# Patient Record
Sex: Male | Born: 1935 | Race: White | Hispanic: No | Marital: Married
Health system: Southern US, Community
[De-identification: ages and names within clinical notes are randomized; demographics above are authoritative.]

---

## 2007-02-23 ENCOUNTER — Encounter: Payer: Self-pay | Admitting: Rheumatology

## 2007-03-17 ENCOUNTER — Encounter: Payer: Self-pay | Admitting: Rheumatology

## 2007-04-22 ENCOUNTER — Ambulatory Visit: Payer: Self-pay | Admitting: General Practice

## 2007-04-28 ENCOUNTER — Ambulatory Visit: Payer: Self-pay | Admitting: General Practice

## 2007-05-27 ENCOUNTER — Ambulatory Visit: Payer: Self-pay | Admitting: Pain Medicine

## 2007-06-11 ENCOUNTER — Ambulatory Visit: Payer: Self-pay | Admitting: Pain Medicine

## 2007-06-22 ENCOUNTER — Ambulatory Visit: Payer: Self-pay | Admitting: Physician Assistant

## 2007-06-25 ENCOUNTER — Ambulatory Visit: Payer: Self-pay | Admitting: Pain Medicine

## 2007-07-02 ENCOUNTER — Encounter: Payer: Self-pay | Admitting: Pain Medicine

## 2007-07-09 ENCOUNTER — Ambulatory Visit: Payer: Self-pay | Admitting: Physician Assistant

## 2007-07-18 ENCOUNTER — Encounter: Payer: Self-pay | Admitting: Pain Medicine

## 2007-07-28 ENCOUNTER — Ambulatory Visit: Payer: Self-pay | Admitting: Pain Medicine

## 2007-08-17 ENCOUNTER — Ambulatory Visit: Payer: Self-pay | Admitting: Pain Medicine

## 2007-11-05 ENCOUNTER — Encounter: Payer: Self-pay | Admitting: Pain Medicine

## 2007-11-23 ENCOUNTER — Encounter: Payer: Self-pay | Admitting: Internal Medicine

## 2007-12-16 ENCOUNTER — Encounter: Payer: Self-pay | Admitting: Internal Medicine

## 2008-01-08 ENCOUNTER — Ambulatory Visit: Payer: Self-pay | Admitting: Internal Medicine

## 2008-01-15 ENCOUNTER — Encounter: Payer: Self-pay | Admitting: Internal Medicine

## 2008-03-02 ENCOUNTER — Encounter: Payer: Self-pay | Admitting: Internal Medicine

## 2008-03-16 ENCOUNTER — Encounter: Payer: Self-pay | Admitting: Internal Medicine

## 2008-03-23 ENCOUNTER — Ambulatory Visit: Payer: Self-pay | Admitting: Internal Medicine

## 2008-03-31 ENCOUNTER — Ambulatory Visit: Payer: Self-pay | Admitting: Rheumatology

## 2008-04-16 ENCOUNTER — Encounter: Payer: Self-pay | Admitting: Internal Medicine

## 2008-04-27 ENCOUNTER — Ambulatory Visit: Payer: Self-pay | Admitting: General Practice

## 2008-04-28 ENCOUNTER — Ambulatory Visit: Payer: Self-pay | Admitting: Internal Medicine

## 2008-05-17 ENCOUNTER — Encounter: Payer: Self-pay | Admitting: Internal Medicine

## 2008-06-07 ENCOUNTER — Ambulatory Visit: Payer: Self-pay | Admitting: Pain Medicine

## 2008-06-16 ENCOUNTER — Encounter: Payer: Self-pay | Admitting: Internal Medicine

## 2008-06-27 ENCOUNTER — Ambulatory Visit: Payer: Self-pay | Admitting: Physician Assistant

## 2008-07-11 ENCOUNTER — Ambulatory Visit: Payer: Self-pay | Admitting: Unknown Physician Specialty

## 2008-07-13 ENCOUNTER — Inpatient Hospital Stay: Payer: Self-pay | Admitting: Unknown Physician Specialty

## 2008-07-19 ENCOUNTER — Encounter: Payer: Self-pay | Admitting: Internal Medicine

## 2008-09-29 ENCOUNTER — Encounter: Payer: Self-pay | Admitting: Unknown Physician Specialty

## 2008-10-17 ENCOUNTER — Encounter: Payer: Self-pay | Admitting: Unknown Physician Specialty

## 2008-11-14 ENCOUNTER — Encounter: Payer: Self-pay | Admitting: Unknown Physician Specialty

## 2009-01-09 ENCOUNTER — Ambulatory Visit: Payer: Self-pay | Admitting: General Practice

## 2009-01-24 ENCOUNTER — Inpatient Hospital Stay: Payer: Self-pay | Admitting: General Practice

## 2009-01-31 ENCOUNTER — Encounter: Payer: Self-pay | Admitting: Internal Medicine

## 2009-02-14 ENCOUNTER — Encounter: Payer: Self-pay | Admitting: Internal Medicine

## 2009-03-16 ENCOUNTER — Encounter: Payer: Self-pay | Admitting: Internal Medicine

## 2009-04-03 ENCOUNTER — Encounter: Payer: Self-pay | Admitting: General Practice

## 2009-04-16 ENCOUNTER — Encounter: Payer: Self-pay | Admitting: General Practice

## 2009-05-17 ENCOUNTER — Encounter: Payer: Self-pay | Admitting: General Practice

## 2009-06-16 ENCOUNTER — Encounter: Payer: Self-pay | Admitting: General Practice

## 2009-08-05 ENCOUNTER — Ambulatory Visit: Payer: Self-pay | Admitting: Internal Medicine

## 2009-10-18 ENCOUNTER — Ambulatory Visit: Payer: Self-pay | Admitting: Unknown Physician Specialty

## 2009-11-17 ENCOUNTER — Ambulatory Visit: Payer: Self-pay | Admitting: Ophthalmology

## 2009-11-17 ENCOUNTER — Ambulatory Visit: Payer: Self-pay | Admitting: Cardiovascular Disease

## 2009-11-27 ENCOUNTER — Ambulatory Visit: Payer: Self-pay | Admitting: Ophthalmology

## 2010-04-05 ENCOUNTER — Encounter: Payer: Self-pay | Admitting: Surgery

## 2010-04-16 ENCOUNTER — Encounter: Payer: Self-pay | Admitting: Surgery

## 2010-05-17 ENCOUNTER — Encounter: Payer: Self-pay | Admitting: Surgery

## 2010-06-16 ENCOUNTER — Encounter: Payer: Self-pay | Admitting: Surgery

## 2010-08-14 ENCOUNTER — Ambulatory Visit: Payer: Self-pay | Admitting: General Practice

## 2010-08-27 ENCOUNTER — Inpatient Hospital Stay: Payer: Self-pay | Admitting: General Practice

## 2010-08-29 LAB — PATHOLOGY REPORT

## 2010-08-31 ENCOUNTER — Encounter: Payer: Self-pay | Admitting: Internal Medicine

## 2010-09-16 ENCOUNTER — Encounter: Payer: Self-pay | Admitting: Internal Medicine

## 2010-10-17 ENCOUNTER — Encounter: Payer: Self-pay | Admitting: Internal Medicine

## 2011-01-09 ENCOUNTER — Encounter: Payer: Self-pay | Admitting: Surgery

## 2011-01-15 ENCOUNTER — Encounter: Payer: Self-pay | Admitting: Surgery

## 2011-02-06 ENCOUNTER — Ambulatory Visit: Payer: Self-pay | Admitting: Unknown Physician Specialty

## 2011-02-15 ENCOUNTER — Encounter: Payer: Self-pay | Admitting: Surgery

## 2011-08-16 ENCOUNTER — Emergency Department: Payer: Self-pay | Admitting: *Deleted

## 2011-08-29 ENCOUNTER — Ambulatory Visit: Payer: Self-pay

## 2011-09-02 ENCOUNTER — Ambulatory Visit: Payer: Self-pay

## 2011-09-04 ENCOUNTER — Ambulatory Visit: Payer: Self-pay | Admitting: General Surgery

## 2011-09-26 ENCOUNTER — Ambulatory Visit: Payer: Self-pay | Admitting: General Surgery

## 2011-09-27 LAB — CANCER ANTIGEN 19-9: CA 19-9: 14 U/mL (ref 0–35)

## 2011-10-18 ENCOUNTER — Ambulatory Visit: Payer: Self-pay | Admitting: General Surgery

## 2011-11-01 ENCOUNTER — Ambulatory Visit: Payer: Self-pay | Admitting: Cardiovascular Disease

## 2011-11-01 LAB — CBC WITH DIFFERENTIAL/PLATELET
Basophil #: 0 x10 3/mm 3 (ref 0.0–0.1)
Basophil %: 0.8 %
Eosinophil #: 0.2 x10 3/mm 3 (ref 0.0–0.7)
Eosinophil %: 3.3 %
HCT: 43.8 % (ref 40.0–52.0)
HGB: 14.8 g/dL (ref 13.0–18.0)
Lymphocyte %: 30.8 %
Lymphs Abs: 1.6 x10 3/mm 3 (ref 1.0–3.6)
MCH: 30.3 pg (ref 26.0–34.0)
MCHC: 33.7 g/dL (ref 32.0–36.0)
MCV: 90 fL (ref 80–100)
Monocyte #: 0.5 x10 3/mm 3 (ref 0.0–0.7)
Monocyte %: 10.3 %
Neutrophil #: 2.9 x10 3/mm 3 (ref 1.4–6.5)
Neutrophil %: 54.8 %
Platelet: 190 x10 3/mm 3 (ref 150–440)
RBC: 4.88 x10 6/mm 3 (ref 4.40–5.90)
RDW: 13.6 % (ref 11.5–14.5)
WBC: 5.3 x10 3/mm 3 (ref 3.8–10.6)

## 2011-11-01 LAB — URINALYSIS, COMPLETE
Bacteria: NONE SEEN
Bilirubin,UR: NEGATIVE
Blood: NEGATIVE
Glucose,UR: NEGATIVE mg/dL (ref 0–75)
Ketone: NEGATIVE
Nitrite: NEGATIVE
Ph: 5 (ref 4.5–8.0)
Protein: NEGATIVE
RBC,UR: <1 /HPF (ref 0–5)
Specific Gravity: 1.01 (ref 1.003–1.030)
Squamous Epithelial: <1
WBC UR: 2 /HPF (ref 0–5)

## 2011-11-01 LAB — BASIC METABOLIC PANEL WITH GFR
Anion Gap: 10 (ref 7–16)
BUN: 18 mg/dL (ref 7–18)
Calcium, Total: 9.1 mg/dL (ref 8.5–10.1)
Chloride: 104 mmol/L (ref 98–107)
Co2: 26 mmol/L (ref 21–32)
Creatinine: 1.06 mg/dL (ref 0.60–1.30)
EGFR (African American): >60
EGFR (Non-African Amer.): >60
Glucose: 106 mg/dL — ABNORMAL HIGH (ref 65–99)
Osmolality: 282 (ref 275–301)
Potassium: 4.4 mmol/L (ref 3.5–5.1)
Sodium: 140 mmol/L (ref 136–145)

## 2011-11-08 ENCOUNTER — Ambulatory Visit: Payer: Self-pay | Admitting: Cardiovascular Disease

## 2011-11-15 ENCOUNTER — Ambulatory Visit: Payer: Self-pay | Admitting: General Surgery

## 2011-12-12 ENCOUNTER — Emergency Department: Payer: Self-pay | Admitting: Emergency Medicine

## 2012-02-11 ENCOUNTER — Encounter: Payer: Self-pay | Admitting: General Practice

## 2012-02-15 ENCOUNTER — Encounter: Payer: Self-pay | Admitting: General Practice

## 2012-04-30 ENCOUNTER — Encounter: Payer: Self-pay | Admitting: Unknown Physician Specialty

## 2012-05-17 ENCOUNTER — Encounter: Payer: Self-pay | Admitting: Unknown Physician Specialty

## 2012-06-16 ENCOUNTER — Encounter: Payer: Self-pay | Admitting: Unknown Physician Specialty

## 2012-07-17 ENCOUNTER — Encounter: Payer: Self-pay | Admitting: Unknown Physician Specialty

## 2012-08-19 IMAGING — CT CT CHEST W/ CM
1 of 2 series · 14 of 30 positions shown, 18 images · IV contrast (agent unspecified)
Comparison: Prior chest x-ray of 08/29/2010.

REASON FOR EXAM: abnormal chest xray plural markings on chest XR
COMMENTS:

PROCEDURE:     KCT - KCT CHEST WITH CONTRAST  - August 29, 2011 [DATE]
RESULT:
HISTORY: Abnormal chest x-ray. Pleural thickening.

[Series 2: chest w/ 5.0 i41f 3 · axial · 0.78mm/px · z∈[-343,-68]mm · 14 of 65 slices shown, 18 images]
[im 5/65  mediastinal]
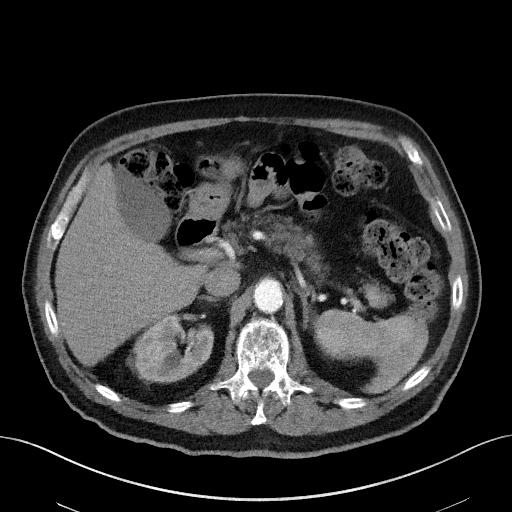
[im 5/65  lung]
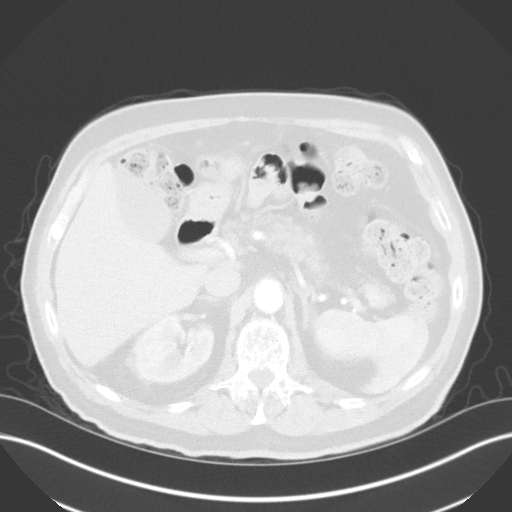
[im 10/65  lung]
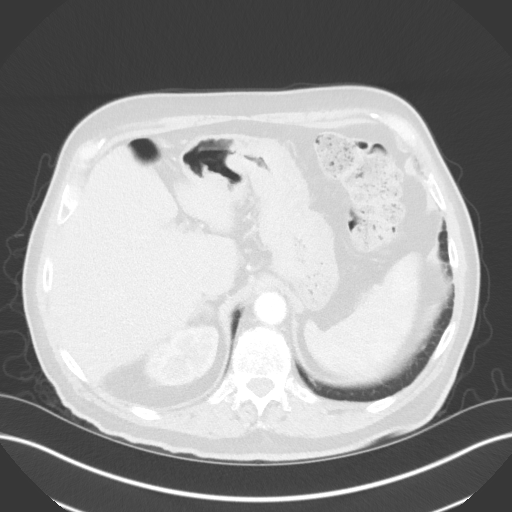
[im 14/65  lung]
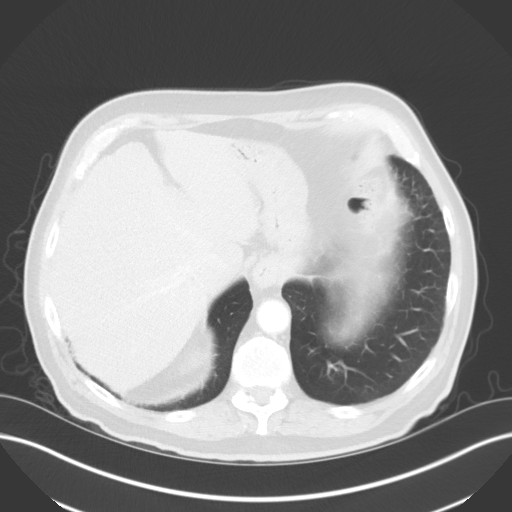
[im 19/65  lung]
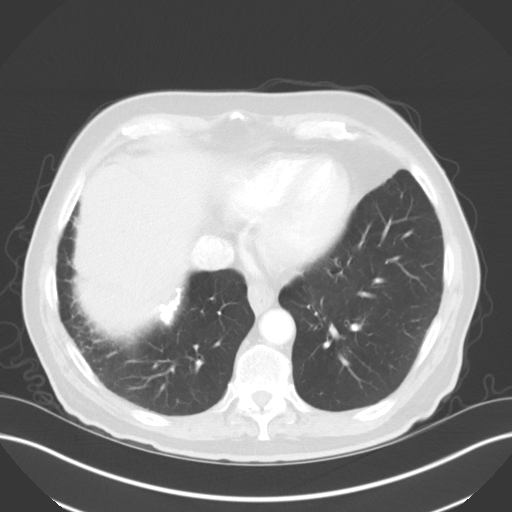
[im 23/65  mediastinal]
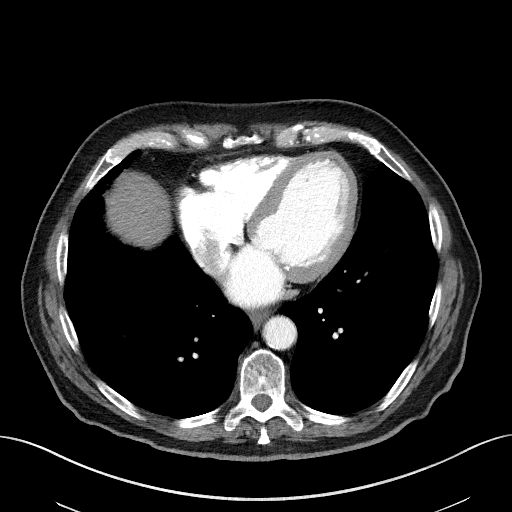
[im 23/65  lung]
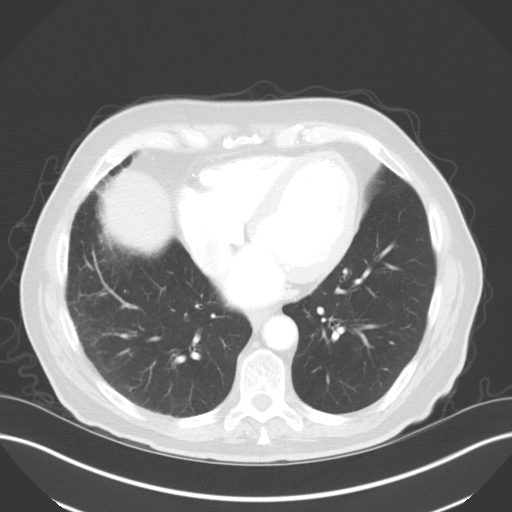
[im 28/65  lung]
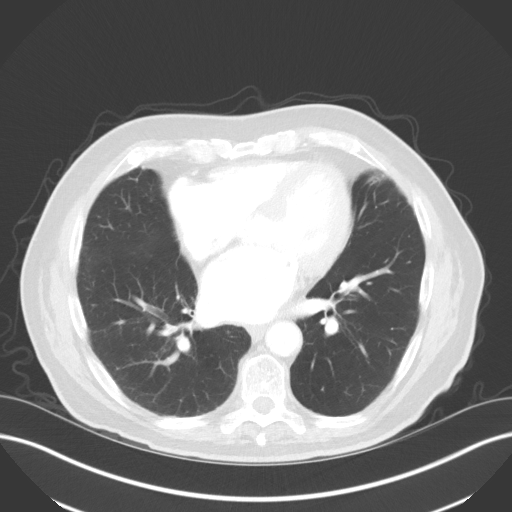
[im 31/65  lung]
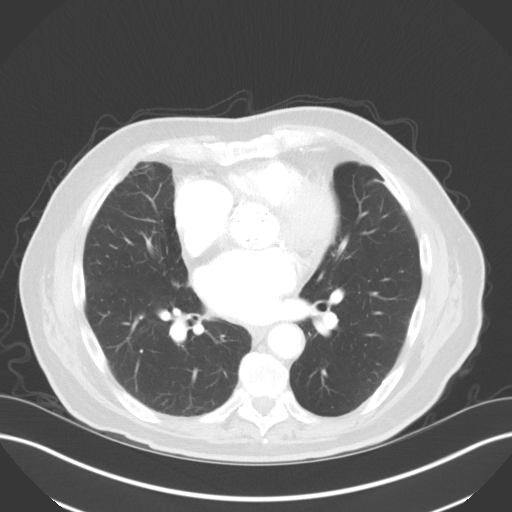
[im 33/65  lung]
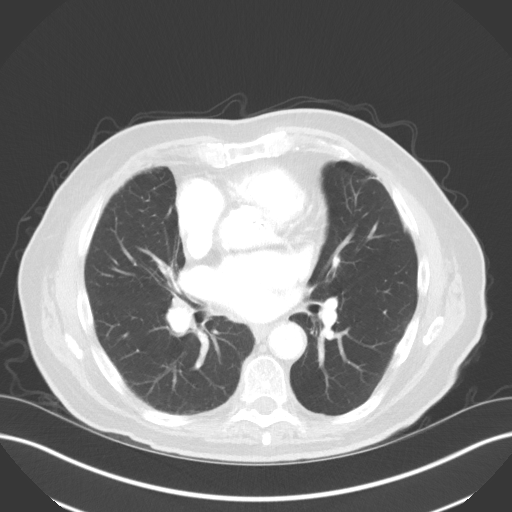
[im 37/65  mediastinal]
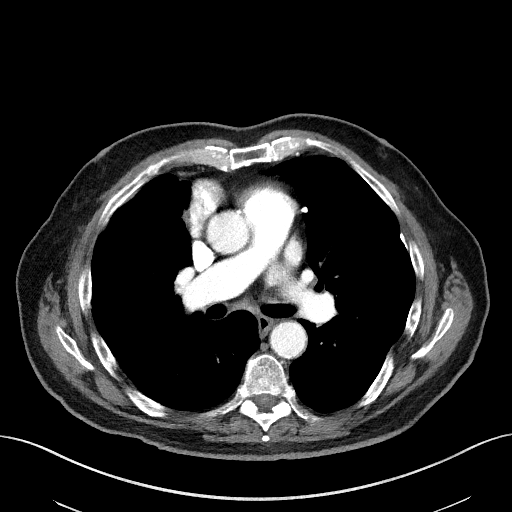
[im 37/65  lung]
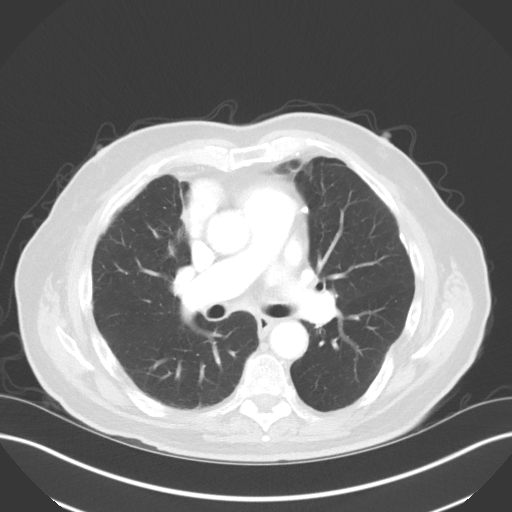
[im 42/65  lung]
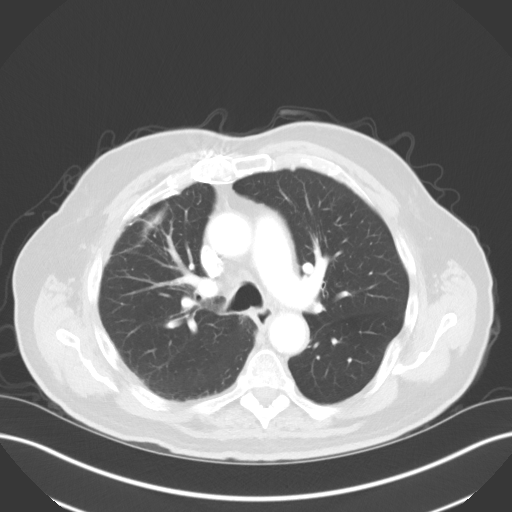
[im 46/65  lung]
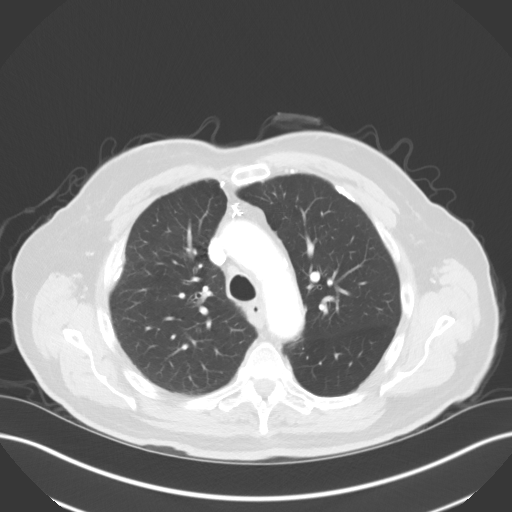
[im 51/65  lung]
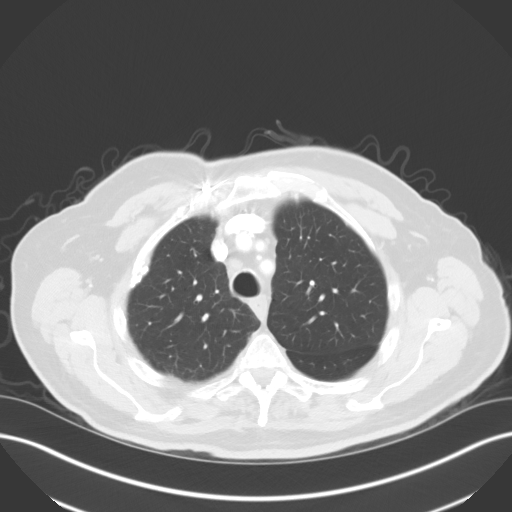
[im 55/65  mediastinal]
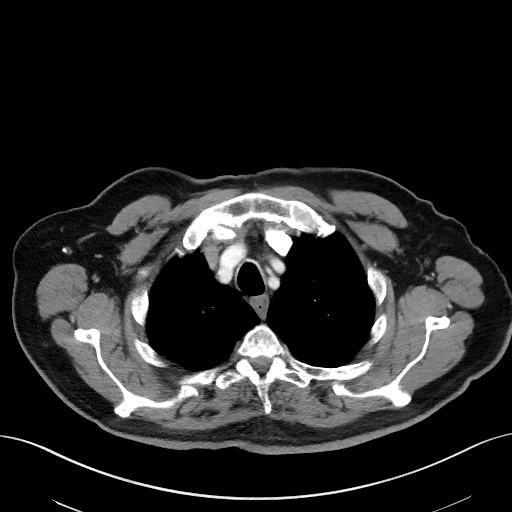
[im 55/65  lung]
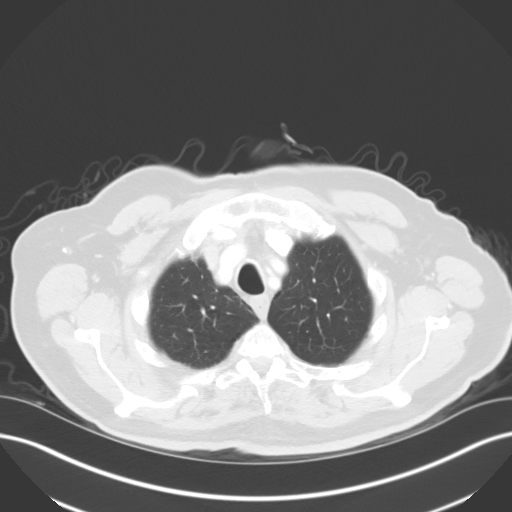
[im 60/65  lung]
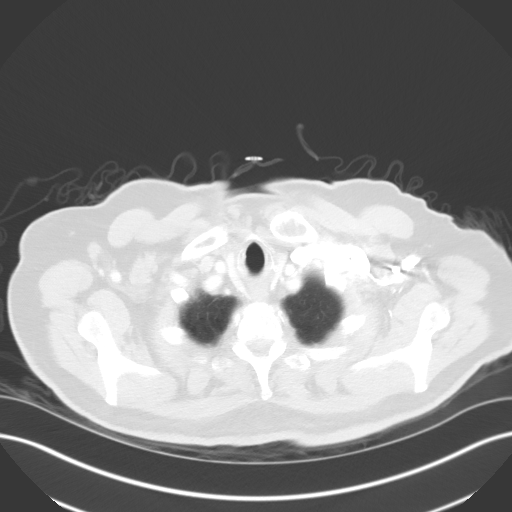

[14 of 30 positions shown; findings below may reference images not displayed]

PROCEDURE AND FINDINGS:   Standard CT obtained with 75 ml of Osovue-VDD. The
large airways are patent. The lungs are clear. Tiny, 1 to 2 mm, nonspecific
pulmonary nodules are noted, best seen on lung CAD images. These most likely
represent granulomas. Calcified pleural plaques are present on the right.
This may be from scarring. Prior asbestos exposure could also present in
this fashion. A pancreatic tail mass cannot be excluded. CT of the abdomen
is suggested for further evaluation. Coronary artery disease. Mitral annular
calcification.
IMPRESSION: 1.  Pancreatic tail mass. For further evaluation, CT of the abdomen is
suggested.
2.  Tiny, pinpoint nodules are noted throughout both lungs. These may
represent granulomas. Tiny, metastatic foci cannot be excluded.
3.  Calcified right pleural plaques. This may be from scarring; however,
prior asbestos exposure could also present in this fashion.
4.  Coronary artery disease.
5.  Mitral annular calcification.

## 2012-12-02 IMAGING — CT CT HEAD WITHOUT CONTRAST
1 series · 16 of 30 positions shown, 20 images · non-contrast
Comparison: none

REASON FOR EXAM: s/p fall
COMMENTS:

[Series 602: soft tissue · axial · 0.41mm/px · z∈[-110,+24]mm · 16 of 31 slices shown, 20 images]
[im 2/31  brain]
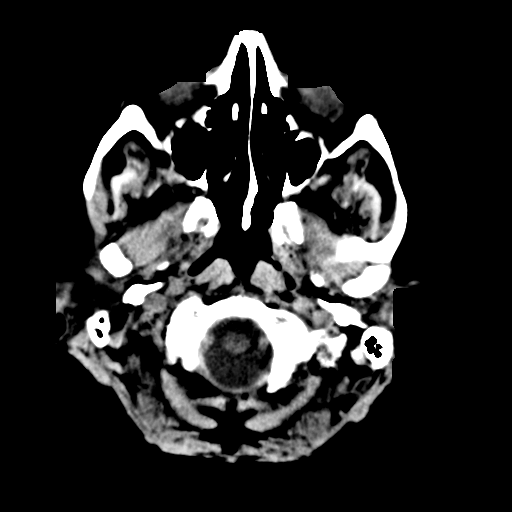
[im 2/31  bone]
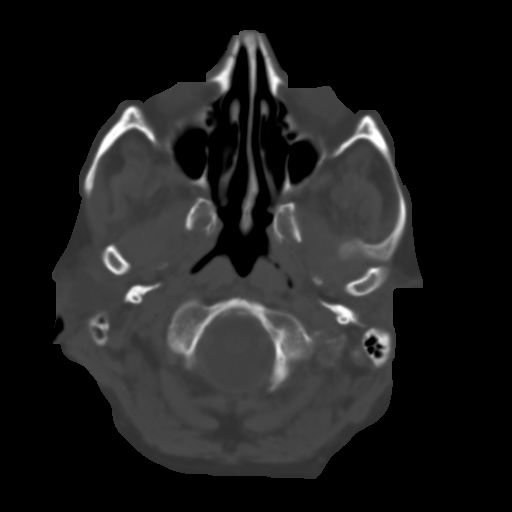
[im 4/31  brain]
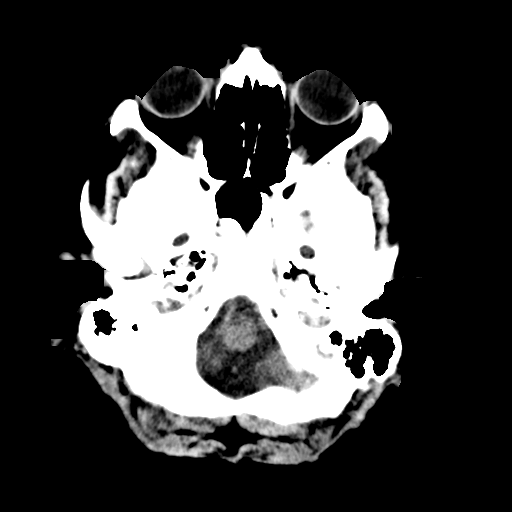
[im 6/31  brain]
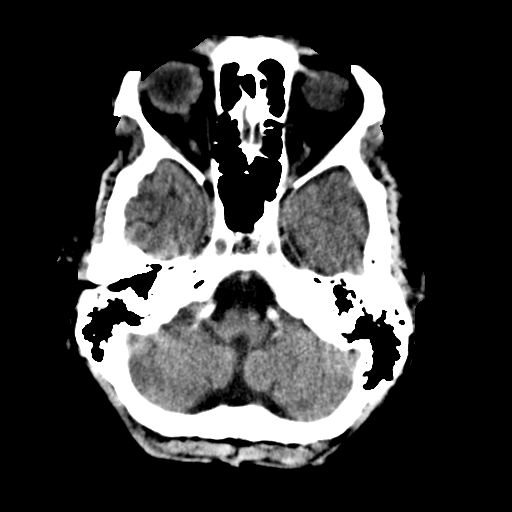
[im 8/31  brain]
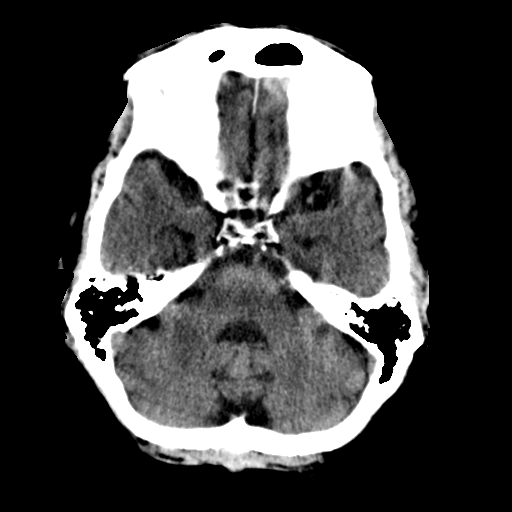
[im 9/31  brain]
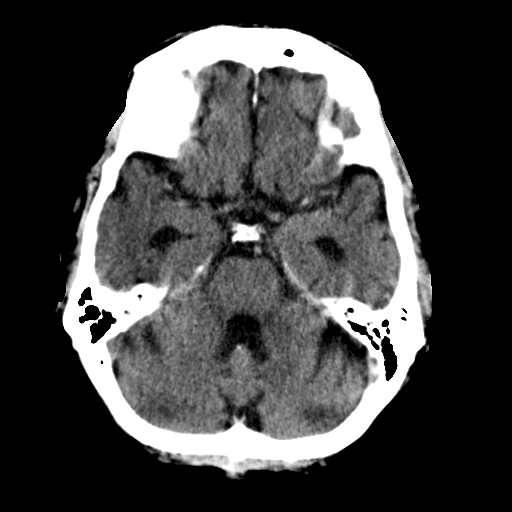
[im 9/31  bone]
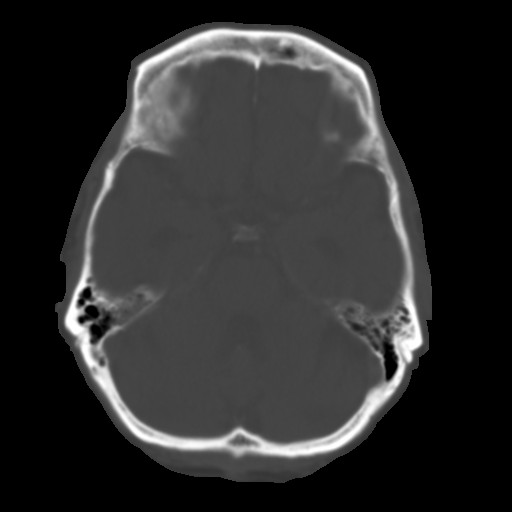
[im 11/31  brain]
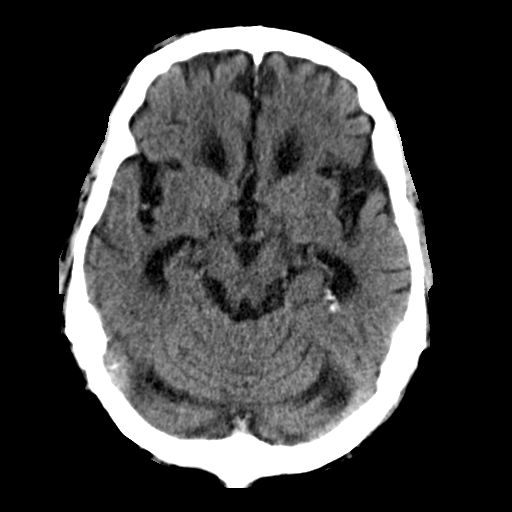
[im 13/31  brain]
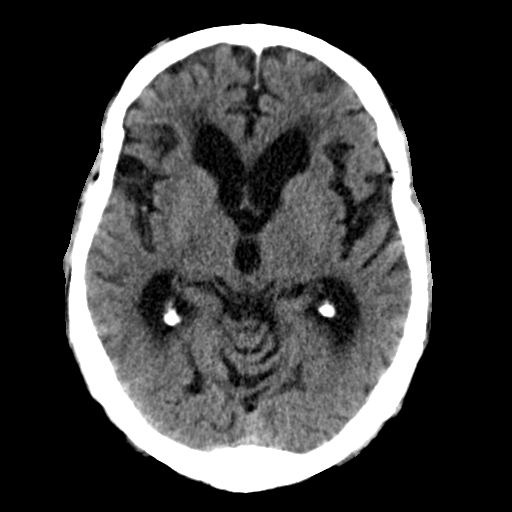
[im 15/31  brain]
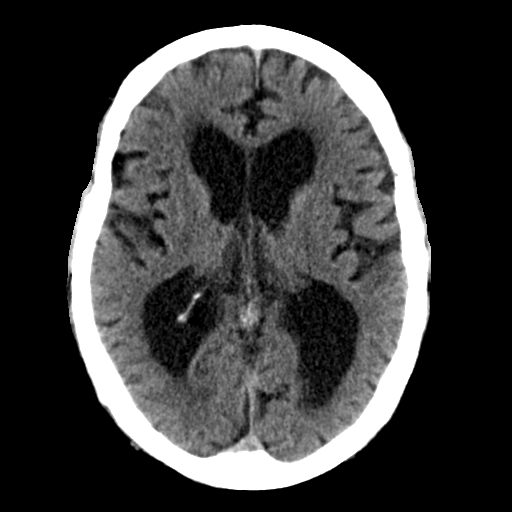
[im 16/31  brain]
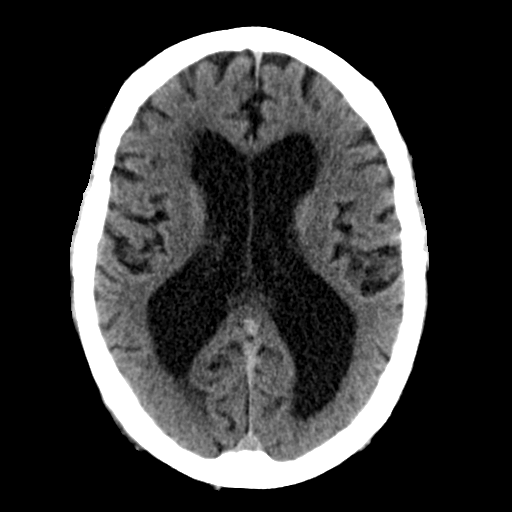
[im 16/31  bone]
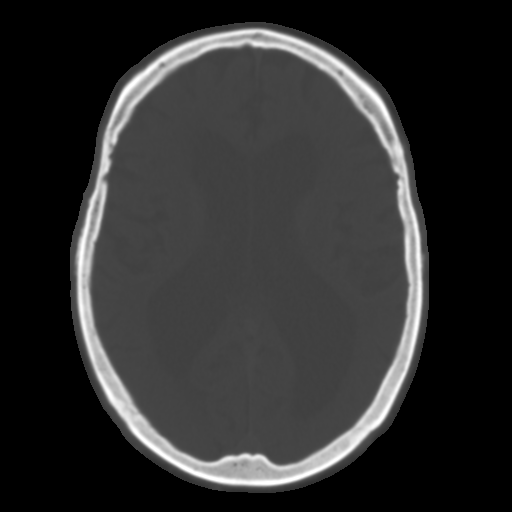
[im 18/31  brain]
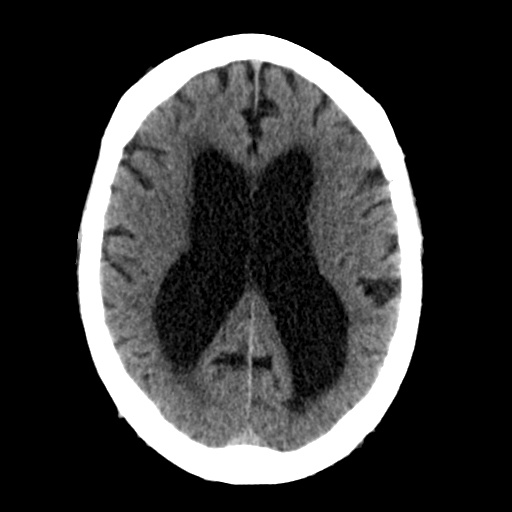
[im 20/31  brain]
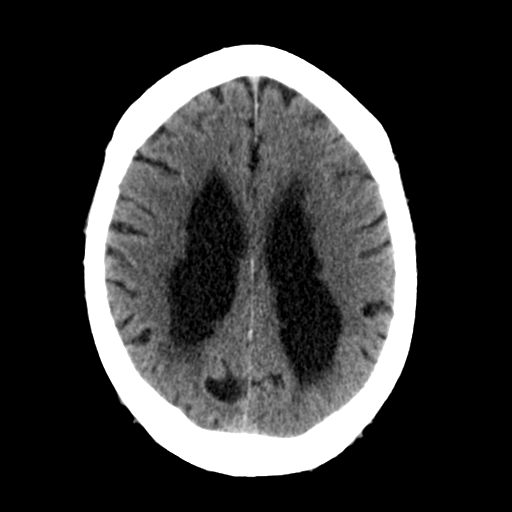
[im 22/31  brain]
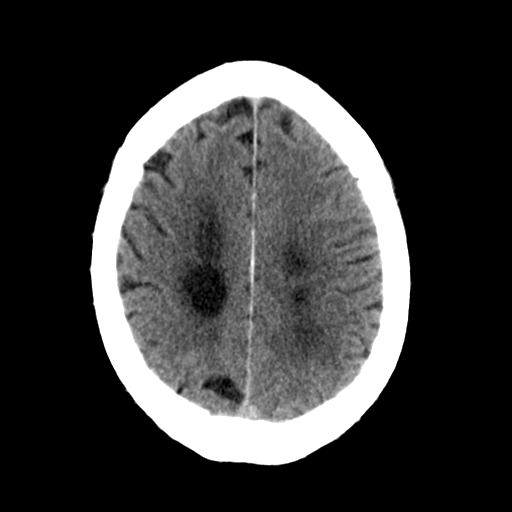
[im 23/31  brain]
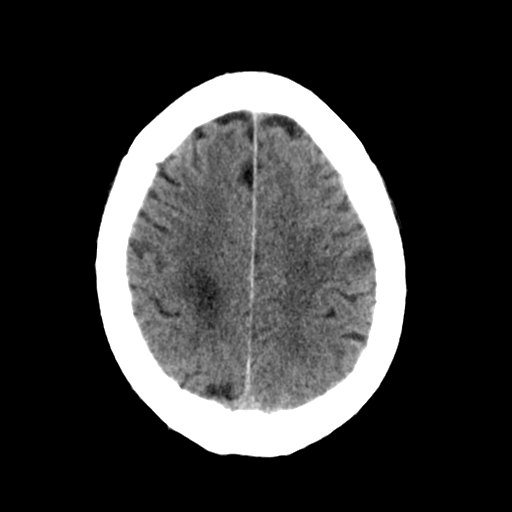
[im 23/31  bone]
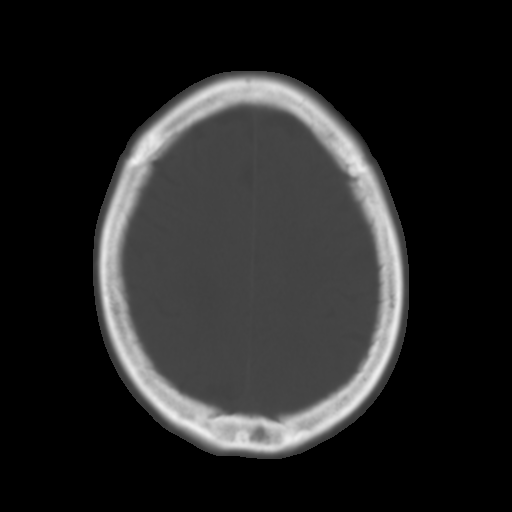
[im 25/31  brain]
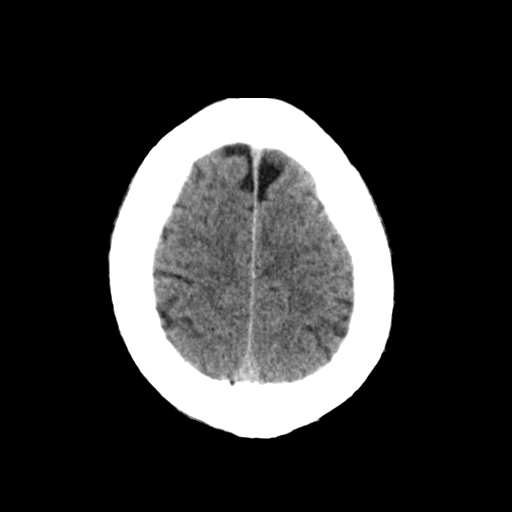
[im 27/31  brain]
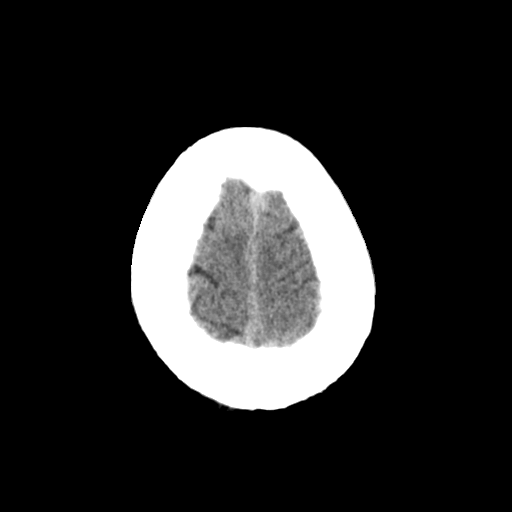
[im 29/31  brain]
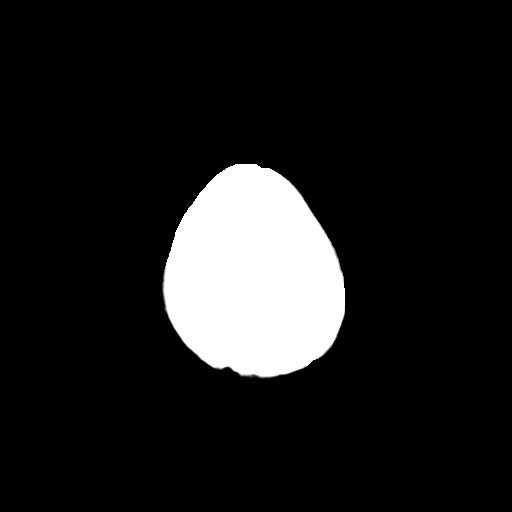

[16 of 30 positions shown; findings below may reference images not displayed]

PROCEDURE:     CT  - CT HEAD WITHOUT CONTRAST  - December 12, 2011  [DATE]

RESULT:     Axial noncontrast CT scanning was performed through the brain
with reconstructions at 5 mm intervals and slice thicknesses. Comparison is
made to study August 16, 2011.

There is moderate ventriculomegaly which appears stable. There is moderate
diffuse cerebral and cerebellar atrophy which is also stable. There is no
intracranial hemorrhage nor intracranial mass effect. There is no evidence
of an evolving ischemic infarction. At bone window settings the observed
portions of the paranasal sinuses and mastoid air cells are clear. I do not
see evidence of an acute skull fracture.
IMPRESSION: 1. I see no evidence of an acute intracranial hemorrhage. There is no
evidence of an evolving ischemic infarction.
2. There is chronic moderate hydrocephalus with some mild to moderate
diffuse cerebral and cerebellar atrophy.

## 2012-12-31 ENCOUNTER — Ambulatory Visit: Payer: Self-pay | Admitting: Physical Medicine and Rehabilitation

## 2013-07-30 ENCOUNTER — Ambulatory Visit: Payer: Self-pay

## 2014-08-16 ENCOUNTER — Emergency Department: Payer: Self-pay | Admitting: Emergency Medicine

## 2014-08-16 LAB — URINALYSIS, COMPLETE
BLOOD: NEGATIVE
Bacteria: NONE SEEN
Bilirubin,UR: NEGATIVE
Glucose,UR: NEGATIVE mg/dL (ref 0–75)
Hyaline Cast: 13
Ketone: NEGATIVE
LEUKOCYTE ESTERASE: NEGATIVE
NITRITE: NEGATIVE
Ph: 5 (ref 4.5–8.0)
Protein: NEGATIVE
RBC,UR: <1 /HPF (ref 0–5)
Specific Gravity: 1.011 (ref 1.003–1.030)
Squamous Epithelial: <1
WBC UR: 3 /HPF (ref 0–5)

## 2014-08-16 LAB — COMPREHENSIVE METABOLIC PANEL
Albumin: 3.4 g/dL (ref 3.4–5.0)
Alkaline Phosphatase: 109 U/L
Anion Gap: 3 — ABNORMAL LOW (ref 7–16)
BUN: 28 mg/dL — ABNORMAL HIGH (ref 7–18)
Bilirubin,Total: 1.4 mg/dL — ABNORMAL HIGH (ref 0.2–1.0)
CHLORIDE: 99 mmol/L (ref 98–107)
CREATININE: 1.56 mg/dL — AB (ref 0.60–1.30)
Calcium, Total: 8.7 mg/dL (ref 8.5–10.1)
Co2: 36 mmol/L — ABNORMAL HIGH (ref 21–32)
EGFR (Non-African Amer.): 45 — ABNORMAL LOW
GFR CALC AF AMER: 55 — AB
Glucose: 117 mg/dL — ABNORMAL HIGH (ref 65–99)
OSMOLALITY: 282 (ref 275–301)
POTASSIUM: 3.9 mmol/L (ref 3.5–5.1)
SGOT(AST): 22 U/L (ref 15–37)
SGPT (ALT): 23 U/L
SODIUM: 138 mmol/L (ref 136–145)
Total Protein: 7.5 g/dL (ref 6.4–8.2)

## 2014-08-16 LAB — TROPONIN I

## 2014-08-16 LAB — CBC WITH DIFFERENTIAL/PLATELET
BASOS PCT: 0.6 %
Basophil #: 0 10*3/uL (ref 0.0–0.1)
EOS ABS: 0.2 10*3/uL (ref 0.0–0.7)
EOS PCT: 2.6 %
HCT: 38.9 % — AB (ref 40.0–52.0)
HGB: 12.5 g/dL — AB (ref 13.0–18.0)
Lymphocyte #: 0.8 10*3/uL — ABNORMAL LOW (ref 1.0–3.6)
Lymphocyte %: 12.9 %
MCH: 30.4 pg (ref 26.0–34.0)
MCHC: 32.1 g/dL (ref 32.0–36.0)
MCV: 95 fL (ref 80–100)
MONOS PCT: 12.9 %
Monocyte #: 0.8 x10 3/mm (ref 0.2–1.0)
Neutrophil #: 4.3 10*3/uL (ref 1.4–6.5)
Neutrophil %: 71 %
Platelet: 135 10*3/uL — ABNORMAL LOW (ref 150–440)
RBC: 4.1 10*6/uL — AB (ref 4.40–5.90)
RDW: 15.5 % — AB (ref 11.5–14.5)
WBC: 6.1 10*3/uL (ref 3.8–10.6)

## 2014-08-16 LAB — LIPASE, BLOOD: Lipase: 165 U/L (ref 73–393)

## 2014-09-18 ENCOUNTER — Inpatient Hospital Stay: Payer: Self-pay | Admitting: Internal Medicine

## 2014-09-18 LAB — URINALYSIS, COMPLETE
BACTERIA: NONE SEEN
Bilirubin,UR: NEGATIVE
Blood: NEGATIVE
Glucose,UR: NEGATIVE mg/dL (ref 0–75)
KETONE: NEGATIVE
Leukocyte Esterase: NEGATIVE
Nitrite: NEGATIVE
Ph: 5 (ref 4.5–8.0)
RBC,UR: 3 /HPF (ref 0–5)
SPECIFIC GRAVITY: 1.011 (ref 1.003–1.030)
Squamous Epithelial: 2

## 2014-09-18 LAB — COMPREHENSIVE METABOLIC PANEL
ALBUMIN: 3.4 g/dL (ref 3.4–5.0)
ALT: 19 U/L
ANION GAP: 8 (ref 7–16)
Alkaline Phosphatase: 115 U/L
BUN: 34 mg/dL — AB (ref 7–18)
Bilirubin,Total: 1.4 mg/dL — ABNORMAL HIGH (ref 0.2–1.0)
CHLORIDE: 95 mmol/L — AB (ref 98–107)
Calcium, Total: 8.8 mg/dL (ref 8.5–10.1)
Co2: 36 mmol/L — ABNORMAL HIGH (ref 21–32)
Creatinine: 2.02 mg/dL — ABNORMAL HIGH (ref 0.60–1.30)
EGFR (Non-African Amer.): 34 — ABNORMAL LOW
GFR CALC AF AMER: 41 — AB
GLUCOSE: 95 mg/dL (ref 65–99)
Osmolality: 285 (ref 275–301)
Potassium: 4.1 mmol/L (ref 3.5–5.1)
SGOT(AST): 23 U/L (ref 15–37)
Sodium: 139 mmol/L (ref 136–145)
TOTAL PROTEIN: 7.5 g/dL (ref 6.4–8.2)

## 2014-09-18 LAB — CK TOTAL AND CKMB (NOT AT ARMC)
CK, Total: 33 U/L — ABNORMAL LOW (ref 39–308)
CK-MB: 0.6 ng/mL (ref 0.5–3.6)

## 2014-09-18 LAB — CBC
HCT: 40.2 % (ref 40.0–52.0)
HGB: 12.8 g/dL — ABNORMAL LOW (ref 13.0–18.0)
MCH: 30.7 pg (ref 26.0–34.0)
MCHC: 31.9 g/dL — ABNORMAL LOW (ref 32.0–36.0)
MCV: 96 fL (ref 80–100)
Platelet: 116 10*3/uL — ABNORMAL LOW (ref 150–440)
RBC: 4.18 10*6/uL — AB (ref 4.40–5.90)
RDW: 16.2 % — ABNORMAL HIGH (ref 11.5–14.5)
WBC: 6.7 10*3/uL (ref 3.8–10.6)

## 2014-09-18 LAB — PRO B NATRIURETIC PEPTIDE: B-TYPE NATIURETIC PEPTID: 2849 pg/mL — AB (ref 0–450)

## 2014-09-18 LAB — PROTIME-INR
INR: 1.1
PROTHROMBIN TIME: 14.4 s (ref 11.5–14.7)

## 2014-09-18 LAB — TROPONIN I: Troponin-I: <0.02 ng/mL

## 2014-09-19 LAB — CBC WITH DIFFERENTIAL/PLATELET
BASOS ABS: 0 10*3/uL (ref 0.0–0.1)
BASOS PCT: 0.2 %
EOS ABS: 0 10*3/uL (ref 0.0–0.7)
Eosinophil %: 0.1 %
HCT: 38.2 % — ABNORMAL LOW (ref 40.0–52.0)
HGB: 12 g/dL — AB (ref 13.0–18.0)
LYMPHS PCT: 7.8 %
Lymphocyte #: 0.4 10*3/uL — ABNORMAL LOW (ref 1.0–3.6)
MCH: 30.1 pg (ref 26.0–34.0)
MCHC: 31.3 g/dL — AB (ref 32.0–36.0)
MCV: 96 fL (ref 80–100)
MONO ABS: 0.2 x10 3/mm (ref 0.2–1.0)
Monocyte %: 4.9 %
NEUTROS PCT: 87 %
Neutrophil #: 4.1 10*3/uL (ref 1.4–6.5)
PLATELETS: 97 10*3/uL — AB (ref 150–440)
RBC: 3.97 10*6/uL — ABNORMAL LOW (ref 4.40–5.90)
RDW: 16.1 % — AB (ref 11.5–14.5)
WBC: 4.7 10*3/uL (ref 3.8–10.6)

## 2014-09-19 LAB — BASIC METABOLIC PANEL
Anion Gap: 11 (ref 7–16)
BUN: 36 mg/dL — AB (ref 7–18)
CHLORIDE: 97 mmol/L — AB (ref 98–107)
Calcium, Total: 8.5 mg/dL (ref 8.5–10.1)
Co2: 31 mmol/L (ref 21–32)
Creatinine: 1.89 mg/dL — ABNORMAL HIGH (ref 0.60–1.30)
EGFR (African American): 44 — ABNORMAL LOW
GFR CALC NON AF AMER: 36 — AB
Glucose: 206 mg/dL — ABNORMAL HIGH (ref 65–99)
OSMOLALITY: 292 (ref 275–301)
Potassium: 3.9 mmol/L (ref 3.5–5.1)
Sodium: 139 mmol/L (ref 136–145)

## 2014-09-19 LAB — TSH: THYROID STIMULATING HORM: 0.883 u[IU]/mL

## 2014-09-19 LAB — SEDIMENTATION RATE: Erythrocyte Sed Rate: 15 mm/hr (ref 0–20)

## 2014-09-20 LAB — COMPREHENSIVE METABOLIC PANEL
ALK PHOS: 90 U/L
ALT: 19 U/L
ANION GAP: 4 — AB (ref 7–16)
Albumin: 3.2 g/dL — ABNORMAL LOW (ref 3.4–5.0)
BUN: 35 mg/dL — ABNORMAL HIGH (ref 7–18)
Bilirubin,Total: 1.8 mg/dL — ABNORMAL HIGH (ref 0.2–1.0)
CREATININE: 1.61 mg/dL — AB (ref 0.60–1.30)
Calcium, Total: 8.8 mg/dL (ref 8.5–10.1)
Chloride: 96 mmol/L — ABNORMAL LOW (ref 98–107)
Co2: 37 mmol/L — ABNORMAL HIGH (ref 21–32)
EGFR (African American): 53 — ABNORMAL LOW
EGFR (Non-African Amer.): 44 — ABNORMAL LOW
Glucose: 105 mg/dL — ABNORMAL HIGH (ref 65–99)
Osmolality: 282 (ref 275–301)
Potassium: 3.9 mmol/L (ref 3.5–5.1)
SGOT(AST): 19 U/L (ref 15–37)
Sodium: 137 mmol/L (ref 136–145)
TOTAL PROTEIN: 6.6 g/dL (ref 6.4–8.2)

## 2014-09-20 LAB — CBC WITH DIFFERENTIAL/PLATELET
BASOS PCT: 0.5 %
Basophil #: 0 10*3/uL (ref 0.0–0.1)
Eosinophil #: 0.2 10*3/uL (ref 0.0–0.7)
Eosinophil %: 1.8 %
HCT: 37 % — ABNORMAL LOW (ref 40.0–52.0)
HGB: 11.8 g/dL — ABNORMAL LOW (ref 13.0–18.0)
LYMPHS ABS: 0.9 10*3/uL — AB (ref 1.0–3.6)
Lymphocyte %: 9.6 %
MCH: 30.4 pg (ref 26.0–34.0)
MCHC: 31.9 g/dL — AB (ref 32.0–36.0)
MCV: 95 fL (ref 80–100)
MONO ABS: 1.1 x10 3/mm — AB (ref 0.2–1.0)
MONOS PCT: 11.7 %
NEUTROS PCT: 76.4 %
Neutrophil #: 6.9 10*3/uL — ABNORMAL HIGH (ref 1.4–6.5)
PLATELETS: 105 10*3/uL — AB (ref 150–440)
RBC: 3.89 10*6/uL — AB (ref 4.40–5.90)
RDW: 16 % — ABNORMAL HIGH (ref 11.5–14.5)
WBC: 9.1 10*3/uL (ref 3.8–10.6)

## 2014-09-20 LAB — PROTEIN / CREATININE RATIO, URINE
CREATININE, URINE: 111 mg/dL (ref 30.0–125.0)
PROTEIN/CREAT. RATIO: 198 mg/g{creat} (ref 0–200)
Protein, Random Urine: 22 mg/dL — ABNORMAL HIGH (ref 0–12)

## 2014-09-20 LAB — PROTEIN ELECTROPHORESIS(ARMC)

## 2014-09-21 LAB — CBC WITH DIFFERENTIAL/PLATELET
BASOS ABS: 0.1 10*3/uL (ref 0.0–0.1)
BASOS PCT: 0.7 %
Eosinophil #: 0.2 10*3/uL (ref 0.0–0.7)
Eosinophil %: 2.4 %
HCT: 38 % — ABNORMAL LOW (ref 40.0–52.0)
HGB: 12.2 g/dL — ABNORMAL LOW (ref 13.0–18.0)
LYMPHS ABS: 1 10*3/uL (ref 1.0–3.6)
Lymphocyte %: 13.6 %
MCH: 30.8 pg (ref 26.0–34.0)
MCHC: 32.2 g/dL (ref 32.0–36.0)
MCV: 96 fL (ref 80–100)
Monocyte #: 1 x10 3/mm (ref 0.2–1.0)
Monocyte %: 12.8 %
NEUTROS ABS: 5.3 10*3/uL (ref 1.4–6.5)
Neutrophil %: 70.5 %
Platelet: 104 10*3/uL — ABNORMAL LOW (ref 150–440)
RBC: 3.97 10*6/uL — ABNORMAL LOW (ref 4.40–5.90)
RDW: 15.3 % — ABNORMAL HIGH (ref 11.5–14.5)
WBC: 7.5 10*3/uL (ref 3.8–10.6)

## 2014-09-21 LAB — BASIC METABOLIC PANEL
Anion Gap: 3 — ABNORMAL LOW (ref 7–16)
BUN: 39 mg/dL — ABNORMAL HIGH (ref 7–18)
CALCIUM: 8.7 mg/dL (ref 8.5–10.1)
CO2: 37 mmol/L — AB (ref 21–32)
Chloride: 95 mmol/L — ABNORMAL LOW (ref 98–107)
Creatinine: 1.62 mg/dL — ABNORMAL HIGH (ref 0.60–1.30)
EGFR (African American): 52 — ABNORMAL LOW
EGFR (Non-African Amer.): 43 — ABNORMAL LOW
Glucose: 104 mg/dL — ABNORMAL HIGH (ref 65–99)
Osmolality: 280 (ref 275–301)
Potassium: 3.8 mmol/L (ref 3.5–5.1)
SODIUM: 135 mmol/L — AB (ref 136–145)

## 2014-09-21 LAB — UR PROT ELECTROPHORESIS, URINE RANDOM

## 2014-09-22 ENCOUNTER — Encounter: Payer: Self-pay | Admitting: Internal Medicine

## 2014-09-25 LAB — BASIC METABOLIC PANEL
Anion Gap: 3 — ABNORMAL LOW (ref 7–16)
BUN: 25 mg/dL — ABNORMAL HIGH (ref 7–18)
CO2: 39 mmol/L — AB (ref 21–32)
CREATININE: 1.45 mg/dL — AB (ref 0.60–1.30)
Calcium, Total: 8.6 mg/dL (ref 8.5–10.1)
Chloride: 98 mmol/L (ref 98–107)
EGFR (Non-African Amer.): 49 — ABNORMAL LOW
GFR CALC AF AMER: 60 — AB
Glucose: 116 mg/dL — ABNORMAL HIGH (ref 65–99)
Osmolality: 285 (ref 275–301)
Potassium: 3.8 mmol/L (ref 3.5–5.1)
SODIUM: 140 mmol/L (ref 136–145)

## 2014-09-29 LAB — URINALYSIS, COMPLETE
Glucose,UR: NEGATIVE mg/dL (ref 0–75)
Ketone: NEGATIVE
NITRITE: NEGATIVE
Ph: 6 (ref 4.5–8.0)
Protein: 100
RBC,UR: >30 /HPF (ref 0–5)
Specific Gravity: 1.01 (ref 1.003–1.030)

## 2014-10-01 LAB — URINE CULTURE

## 2014-10-08 ENCOUNTER — Emergency Department: Payer: Self-pay | Admitting: Emergency Medicine

## 2014-10-08 LAB — URINALYSIS, COMPLETE
Bacteria: NONE SEEN
Bilirubin,UR: NEGATIVE
Blood: NEGATIVE
Glucose,UR: NEGATIVE mg/dL (ref 0–75)
Hyaline Cast: 12
Ketone: NEGATIVE
LEUKOCYTE ESTERASE: NEGATIVE
Nitrite: NEGATIVE
PH: 6 (ref 4.5–8.0)
PROTEIN: NEGATIVE
RBC,UR: 1 /HPF (ref 0–5)
Specific Gravity: 1.006 (ref 1.003–1.030)
Squamous Epithelial: <1
WBC UR: 1 /HPF (ref 0–5)

## 2014-10-08 LAB — COMPREHENSIVE METABOLIC PANEL
ANION GAP: 8 (ref 7–16)
Albumin: 2.9 g/dL — ABNORMAL LOW (ref 3.4–5.0)
Alkaline Phosphatase: 140 U/L — ABNORMAL HIGH
BUN: 27 mg/dL — ABNORMAL HIGH (ref 7–18)
Bilirubin,Total: 1.6 mg/dL — ABNORMAL HIGH (ref 0.2–1.0)
CALCIUM: 9.1 mg/dL (ref 8.5–10.1)
CO2: 30 mmol/L (ref 21–32)
Chloride: 98 mmol/L (ref 98–107)
Creatinine: 1.9 mg/dL — ABNORMAL HIGH (ref 0.60–1.30)
GFR CALC AF AMER: 44 — AB
GFR CALC NON AF AMER: 36 — AB
Glucose: 98 mg/dL (ref 65–99)
OSMOLALITY: 277 (ref 275–301)
POTASSIUM: 4.8 mmol/L (ref 3.5–5.1)
SGOT(AST): 34 U/L (ref 15–37)
SGPT (ALT): 18 U/L
SODIUM: 136 mmol/L (ref 136–145)
Total Protein: 7.1 g/dL (ref 6.4–8.2)

## 2014-10-08 LAB — CBC
HCT: 35.8 % — AB (ref 40.0–52.0)
HGB: 11.1 g/dL — AB (ref 13.0–18.0)
MCH: 29.7 pg (ref 26.0–34.0)
MCHC: 31.1 g/dL — ABNORMAL LOW (ref 32.0–36.0)
MCV: 96 fL (ref 80–100)
Platelet: 138 10*3/uL — ABNORMAL LOW (ref 150–440)
RBC: 3.74 10*6/uL — ABNORMAL LOW (ref 4.40–5.90)
RDW: 15.8 % — ABNORMAL HIGH (ref 11.5–14.5)
WBC: 5.2 10*3/uL (ref 3.8–10.6)

## 2014-10-08 LAB — TROPONIN I: Troponin-I: <0.02 ng/mL

## 2014-10-08 LAB — PRO B NATRIURETIC PEPTIDE: B-TYPE NATIURETIC PEPTID: 5383 pg/mL — AB (ref 0–450)

## 2014-10-08 LAB — D-DIMER(ARMC): D-DIMER: 1233 ng/mL

## 2014-10-17 ENCOUNTER — Encounter: Payer: Self-pay | Admitting: Internal Medicine

## 2014-10-18 LAB — BASIC METABOLIC PANEL
Anion Gap: 5 — ABNORMAL LOW (ref 7–16)
BUN: 26 mg/dL — ABNORMAL HIGH (ref 7–18)
CALCIUM: 9.3 mg/dL (ref 8.5–10.1)
CHLORIDE: 96 mmol/L — AB (ref 98–107)
Co2: 37 mmol/L — ABNORMAL HIGH (ref 21–32)
Creatinine: 1.61 mg/dL — ABNORMAL HIGH (ref 0.60–1.30)
EGFR (African American): 53 — ABNORMAL LOW
EGFR (Non-African Amer.): 43 — ABNORMAL LOW
Glucose: 96 mg/dL (ref 65–99)
Osmolality: 280 (ref 275–301)
POTASSIUM: 3.7 mmol/L (ref 3.5–5.1)
SODIUM: 138 mmol/L (ref 136–145)

## 2014-11-08 ENCOUNTER — Emergency Department: Payer: Self-pay | Admitting: Student

## 2014-11-12 ENCOUNTER — Emergency Department: Payer: Self-pay | Admitting: Emergency Medicine

## 2014-11-15 ENCOUNTER — Encounter
Admit: 2014-11-15 | Disposition: A | Discharge status: Home / Self Care | Payer: Self-pay | Attending: Internal Medicine | Admitting: Internal Medicine

## 2014-11-17 ENCOUNTER — Emergency Department: Payer: Self-pay | Admitting: Emergency Medicine

## 2014-12-16 ENCOUNTER — Encounter
Admit: 2014-12-16 | Disposition: A | Discharge status: Home / Self Care | Payer: Self-pay | Attending: Internal Medicine | Admitting: Internal Medicine

## 2014-12-16 ENCOUNTER — Ambulatory Visit
Admit: 2014-12-16 | Disposition: A | Discharge status: Home / Self Care | Payer: Self-pay | Attending: Internal Medicine | Admitting: Internal Medicine

## 2014-12-27 ENCOUNTER — Emergency Department
Admit: 2014-12-27 | Disposition: A | Discharge status: Home / Self Care | Payer: Self-pay | Admitting: Emergency Medicine

## 2015-01-01 ENCOUNTER — Inpatient Hospital Stay
Admit: 2015-01-01 | Disposition: A | Discharge status: Skilled Nursing Facility | Payer: Self-pay | Attending: Internal Medicine | Admitting: Internal Medicine

## 2015-01-01 LAB — COMPREHENSIVE METABOLIC PANEL
ALBUMIN: 3.3 g/dL — AB
ALT: 11 U/L — AB
AST: 37 U/L
Alkaline Phosphatase: 117 U/L
Anion Gap: 12 (ref 7–16)
BUN: 23 mg/dL — AB
Bilirubin,Total: 3.6 mg/dL — ABNORMAL HIGH
CHLORIDE: 98 mmol/L — AB
CREATININE: 1.65 mg/dL — AB
Calcium, Total: 8.6 mg/dL — ABNORMAL LOW
Co2: 28 mmol/L
EGFR (African American): 43 — ABNORMAL LOW
EGFR (Non-African Amer.): 37 — ABNORMAL LOW
GLUCOSE: 118 mg/dL — AB
POTASSIUM: 4 mmol/L
Sodium: 138 mmol/L
Total Protein: 6.9 g/dL

## 2015-01-01 LAB — CBC WITH DIFFERENTIAL/PLATELET
BASOS PCT: 0.4 %
Basophil #: 0.1 10*3/uL (ref 0.0–0.1)
EOS PCT: 0.1 %
Eosinophil #: 0 10*3/uL (ref 0.0–0.7)
HCT: 35.7 % — AB (ref 40.0–52.0)
HGB: 11.1 g/dL — AB (ref 13.0–18.0)
Lymphocyte #: 0.5 10*3/uL — ABNORMAL LOW (ref 1.0–3.6)
Lymphocyte %: 3 %
MCH: 27.7 pg (ref 26.0–34.0)
MCHC: 31.2 g/dL — ABNORMAL LOW (ref 32.0–36.0)
MCV: 89 fL (ref 80–100)
MONO ABS: 1.1 x10 3/mm — AB (ref 0.2–1.0)
MONOS PCT: 7.1 %
NEUTROS ABS: 13.4 10*3/uL — AB (ref 1.4–6.5)
Neutrophil %: 89.4 %
Platelet: 141 10*3/uL — ABNORMAL LOW (ref 150–440)
RBC: 4.01 10*6/uL — AB (ref 4.40–5.90)
RDW: 17.7 % — ABNORMAL HIGH (ref 11.5–14.5)
WBC: 15 10*3/uL — ABNORMAL HIGH (ref 3.8–10.6)

## 2015-01-01 LAB — BILIRUBIN, DIRECT: Bilirubin, Direct: 1.4 mg/dL — ABNORMAL HIGH

## 2015-01-01 LAB — URINALYSIS, COMPLETE
BILIRUBIN, UR: NEGATIVE
GLUCOSE, UR: NEGATIVE mg/dL (ref 0–75)
Ketone: NEGATIVE
NITRITE: NEGATIVE
Ph: 5 (ref 4.5–8.0)
Protein: 100
SPECIFIC GRAVITY: 1.015 (ref 1.003–1.030)

## 2015-01-01 LAB — LACTIC ACID, PLASMA
LACTIC ACID, VENOUS: 2.5 mmol/L — AB
Lactic Acid, Venous: 3.1 mmol/L
Lactic Acid, Venous: 1.9 mmol/L

## 2015-01-01 LAB — TROPONIN I
Troponin-I: 0.03 ng/mL
Troponin-I: 0.03 ng/mL

## 2015-01-01 LAB — PRO B NATRIURETIC PEPTIDE: B-Type Natriuretic Peptide: 755 pg/mL — ABNORMAL HIGH

## 2015-01-02 LAB — COMPREHENSIVE METABOLIC PANEL
ALBUMIN: 2.8 g/dL — AB
ALK PHOS: 98 U/L
ANION GAP: 6 — AB (ref 7–16)
BILIRUBIN TOTAL: 2.3 mg/dL — AB
BUN: 23 mg/dL — AB
CHLORIDE: 100 mmol/L — AB
Calcium, Total: 8.5 mg/dL — ABNORMAL LOW
Co2: 32 mmol/L
Creatinine: 1.47 mg/dL — ABNORMAL HIGH
EGFR (African American): 49 — ABNORMAL LOW
EGFR (Non-African Amer.): 43 — ABNORMAL LOW
GLUCOSE: 103 mg/dL — AB
Potassium: 3.6 mmol/L
SGOT(AST): 28 U/L
SGPT (ALT): 8 U/L — ABNORMAL LOW
SODIUM: 138 mmol/L
TOTAL PROTEIN: 6 g/dL — AB

## 2015-01-02 LAB — CBC WITH DIFFERENTIAL/PLATELET
BASOS PCT: 0.6 %
Basophil #: 0 10*3/uL (ref 0.0–0.1)
EOS PCT: 1 %
Eosinophil #: 0.1 10*3/uL (ref 0.0–0.7)
HCT: 32.7 % — ABNORMAL LOW (ref 40.0–52.0)
HGB: 10.1 g/dL — ABNORMAL LOW (ref 13.0–18.0)
LYMPHS ABS: 0.3 10*3/uL — AB (ref 1.0–3.6)
Lymphocyte %: 4.5 %
MCH: 27.4 pg (ref 26.0–34.0)
MCHC: 31 g/dL — AB (ref 32.0–36.0)
MCV: 88 fL (ref 80–100)
MONOS PCT: 8.4 %
Monocyte #: 0.6 x10 3/mm (ref 0.2–1.0)
Neutrophil #: 6.5 10*3/uL (ref 1.4–6.5)
Neutrophil %: 85.5 %
PLATELETS: 115 10*3/uL — AB (ref 150–440)
RBC: 3.7 10*6/uL — ABNORMAL LOW (ref 4.40–5.90)
RDW: 17.7 % — ABNORMAL HIGH (ref 11.5–14.5)
WBC: 7.6 10*3/uL (ref 3.8–10.6)

## 2015-01-02 LAB — MAGNESIUM: Magnesium: 2 mg/dL

## 2015-01-04 LAB — URINE CULTURE

## 2015-01-04 LAB — COMPREHENSIVE METABOLIC PANEL
ALBUMIN: 2.8 g/dL — AB
ALK PHOS: 108 U/L
ANION GAP: 9 (ref 7–16)
BUN: 26 mg/dL — AB
Bilirubin,Total: 2.3 mg/dL — ABNORMAL HIGH
CALCIUM: 8.3 mg/dL — AB
Chloride: 99 mmol/L — ABNORMAL LOW
Co2: 34 mmol/L — ABNORMAL HIGH
Creatinine: 1.38 mg/dL — ABNORMAL HIGH
EGFR (African American): 53 — ABNORMAL LOW
EGFR (Non-African Amer.): 46 — ABNORMAL LOW
GLUCOSE: 111 mg/dL — AB
Potassium: 3.6 mmol/L
SGOT(AST): 32 U/L
SGPT (ALT): 11 U/L — ABNORMAL LOW
Sodium: 142 mmol/L
TOTAL PROTEIN: 6.6 g/dL

## 2015-01-04 LAB — CBC WITH DIFFERENTIAL/PLATELET
Basophil #: 0 10*3/uL (ref 0.0–0.1)
Basophil %: 0.6 %
EOS PCT: 0.1 %
Eosinophil #: 0 10*3/uL (ref 0.0–0.7)
HCT: 35.5 % — ABNORMAL LOW (ref 40.0–52.0)
HGB: 11 g/dL — AB (ref 13.0–18.0)
Lymphocyte #: 0.5 10*3/uL — ABNORMAL LOW (ref 1.0–3.6)
Lymphocyte %: 12.1 %
MCH: 27.6 pg (ref 26.0–34.0)
MCHC: 30.9 g/dL — ABNORMAL LOW (ref 32.0–36.0)
MCV: 89 fL (ref 80–100)
MONOS PCT: 15.5 %
Monocyte #: 0.6 x10 3/mm (ref 0.2–1.0)
NEUTROS PCT: 71.7 %
Neutrophil #: 2.8 10*3/uL (ref 1.4–6.5)
Platelet: 100 10*3/uL — ABNORMAL LOW (ref 150–440)
RBC: 3.97 10*6/uL — AB (ref 4.40–5.90)
RDW: 17.4 % — ABNORMAL HIGH (ref 11.5–14.5)
WBC: 3.9 10*3/uL (ref 3.8–10.6)

## 2015-01-04 LAB — PROTIME-INR
INR: 1.3
Prothrombin Time: 16.4 secs — ABNORMAL HIGH

## 2015-01-04 LAB — MAGNESIUM: MAGNESIUM: 2 mg/dL

## 2015-01-04 LAB — PHOSPHORUS: Phosphorus: 3.1 mg/dL

## 2015-01-04 LAB — TROPONIN I: Troponin-I: 0.03 ng/mL

## 2015-01-04 LAB — LACTIC ACID, PLASMA: LACTIC ACID, VENOUS: 2.2 mmol/L — AB

## 2015-01-05 ENCOUNTER — Inpatient Hospital Stay
Admit: 2015-01-05 | Disposition: A | Discharge status: Hospice | Payer: Self-pay | Attending: Internal Medicine | Admitting: Internal Medicine

## 2015-01-05 LAB — URINALYSIS, COMPLETE
BLOOD: NEGATIVE
Bilirubin,UR: NEGATIVE
Glucose,UR: NEGATIVE mg/dL (ref 0–75)
KETONE: NEGATIVE
Leukocyte Esterase: NEGATIVE
NITRITE: NEGATIVE
PROTEIN: NEGATIVE
Ph: 5 (ref 4.5–8.0)
SPECIFIC GRAVITY: 1.018 (ref 1.003–1.030)

## 2015-01-05 LAB — LACTIC ACID, PLASMA: LACTIC ACID, VENOUS: 1.7 mmol/L

## 2015-01-06 LAB — BASIC METABOLIC PANEL
Anion Gap: 7 (ref 7–16)
BUN: 28 mg/dL — ABNORMAL HIGH
Calcium, Total: 8 mg/dL — ABNORMAL LOW
Chloride: 102 mmol/L
Co2: 33 mmol/L — ABNORMAL HIGH
Creatinine: 1.46 mg/dL — ABNORMAL HIGH
EGFR (African American): 50 — ABNORMAL LOW
EGFR (Non-African Amer.): 43 — ABNORMAL LOW
Glucose: 101 mg/dL — ABNORMAL HIGH
Potassium: 3.8 mmol/L
Sodium: 142 mmol/L

## 2015-01-06 LAB — CBC WITH DIFFERENTIAL/PLATELET
Basophil #: 0 10*3/uL (ref 0.0–0.1)
Basophil %: 0.6 %
Eosinophil #: 0 10*3/uL (ref 0.0–0.7)
Eosinophil %: 0.8 %
HCT: 32.3 % — ABNORMAL LOW (ref 40.0–52.0)
HGB: 9.9 g/dL — ABNORMAL LOW (ref 13.0–18.0)
Lymphocyte #: 0.6 10*3/uL — ABNORMAL LOW (ref 1.0–3.6)
Lymphocyte %: 11.9 %
MCH: 27.4 pg (ref 26.0–34.0)
MCHC: 30.6 g/dL — ABNORMAL LOW (ref 32.0–36.0)
MCV: 90 fL (ref 80–100)
Monocyte #: 0.5 x10 3/mm (ref 0.2–1.0)
Monocyte %: 10.5 %
Neutrophil #: 4 10*3/uL (ref 1.4–6.5)
Neutrophil %: 76.2 %
Platelet: 86 10*3/uL — ABNORMAL LOW (ref 150–440)
RBC: 3.61 10*6/uL — ABNORMAL LOW (ref 4.40–5.90)
RDW: 17.5 % — ABNORMAL HIGH (ref 11.5–14.5)
WBC: 5.2 10*3/uL (ref 3.8–10.6)

## 2015-01-06 LAB — CULTURE, BLOOD (SINGLE)

## 2015-01-06 LAB — TSH: Thyroid Stimulating Horm: 1.367 u[IU]/mL

## 2015-01-07 LAB — BASIC METABOLIC PANEL
Anion Gap: 7 (ref 7–16)
BUN: 31 mg/dL — ABNORMAL HIGH
CHLORIDE: 100 mmol/L — AB
CO2: 35 mmol/L — AB
CREATININE: 1.42 mg/dL — AB
Calcium, Total: 8.1 mg/dL — ABNORMAL LOW
EGFR (Non-African Amer.): 44 — ABNORMAL LOW
GFR CALC AF AMER: 51 — AB
GLUCOSE: 159 mg/dL — AB
Potassium: 3.5 mmol/L
Sodium: 142 mmol/L

## 2015-01-07 LAB — CBC WITH DIFFERENTIAL/PLATELET
Basophil #: 0 10*3/uL (ref 0.0–0.1)
Basophil %: 0.1 %
EOS PCT: 0 %
Eosinophil #: 0 10*3/uL (ref 0.0–0.7)
HCT: 35.3 % — AB (ref 40.0–52.0)
HGB: 10.6 g/dL — ABNORMAL LOW (ref 13.0–18.0)
LYMPHS ABS: 0.3 10*3/uL — AB (ref 1.0–3.6)
Lymphocyte %: 6.7 %
MCH: 27.3 pg (ref 26.0–34.0)
MCHC: 30.1 g/dL — ABNORMAL LOW (ref 32.0–36.0)
MCV: 91 fL (ref 80–100)
MONO ABS: 0.2 x10 3/mm (ref 0.2–1.0)
Monocyte %: 3.5 %
NEUTROS ABS: 4.5 10*3/uL (ref 1.4–6.5)
Neutrophil %: 89.7 %
PLATELETS: 73 10*3/uL — AB (ref 150–440)
RBC: 3.89 10*6/uL — ABNORMAL LOW (ref 4.40–5.90)
RDW: 17.9 % — ABNORMAL HIGH (ref 11.5–14.5)
WBC: 5 10*3/uL (ref 3.8–10.6)

## 2015-01-07 LAB — MAGNESIUM: Magnesium: 2.3 mg/dL

## 2015-01-07 LAB — VANCOMYCIN, TROUGH: Vancomycin, Trough: 24 ug/mL

## 2015-01-07 LAB — URINE CULTURE

## 2015-01-08 LAB — BASIC METABOLIC PANEL
Anion Gap: 7 (ref 7–16)
BUN: 39 mg/dL — ABNORMAL HIGH
Calcium, Total: 8.2 mg/dL — ABNORMAL LOW
Chloride: 98 mmol/L — ABNORMAL LOW
Co2: 33 mmol/L — ABNORMAL HIGH
Creatinine: 1.53 mg/dL — ABNORMAL HIGH
EGFR (African American): 47 — ABNORMAL LOW
EGFR (Non-African Amer.): 41 — ABNORMAL LOW
Glucose: 159 mg/dL — ABNORMAL HIGH
Potassium: 3.7 mmol/L
Sodium: 138 mmol/L

## 2015-01-08 LAB — PLATELET COUNT: Platelet: 80 10*3/uL — ABNORMAL LOW (ref 150–440)

## 2015-01-08 NOTE — Op Note (Signed)
PATIENT NAME:  Francisco [name], Francisco [name] MR#:  [id number] DATE OF BIRTH:  02/21/1927  DATE OF PROCEDURE:  11/08/2011  PREOPERATIVE DIAGNOSES:   1. Sinoatrial node dysfunction with symptomatic bradycardia.  2. History of premature ventricular contractions and bigeminy with medication limitations secondary to bradycardia.  3. Fatigue.   POSTOPERATIVE DIAGNOSES:    1. Sinoatrial node dysfunction with symptomatic bradycardia.  2. History of premature ventricular contractions and bigeminy with medication limitations secondary to bradycardia.  3. Fatigue.  4. Status post dual chamber pacemaker implantation (Medtronic Revo MRI).   PROCEDURE:  Pacemaker implantation (Medtronic).   SURGEON:  Brion Aliment. Garvey Westcott, MD.   SEDATION: Monitored anesthesia care.   LOCATION: [facility] Operating Room #4.  COMPLICATIONS: None.  MEDICATIONS: See anesthesia records.   INDICATIONS AND CONSENT: Mr. Mika was evaluated in the [facility] Electrophysiology Consultation clinic for frequent bigeminy and symptomatic bradycardia with discussion of treatment options including consideration of premature ventricular contractions ablation versus anti-arrhythmic versus beta-blocker therapy with pacemaker support to prevent worsening of bradycardia. The patient acknowledged the risks, benefits and alternatives, consideration of various treatment strategies and expressed wishes in favor of pacemaker implantation to be followed with initiation of beta blocker treatment and follow-up of symptoms and rhythm.   DESCRIPTION OF PROCEDURE:  The patient was brought to the EP laboratory/operating room in a fasting and nonsedated state. Time was performed. Appropriate resuscitative equipment was attached. The left infraclavicular region was prepped with chlorhexidine followed by ChloraPrep and draped in the usual sterile manner with Ioban over the surgical site in the left infraclavicular region. An incision was made and  extended to the prepectoralis fascia. A subcutaneous pocket was fashioned inferiorly and medially to accommodate pacemaker hardware. Gentamicin soaked radiopaque gauze was placed in the pocket. Central venous access was obtained using fluoroscopic landmark alone (no use of IV contrast dye). With #8 French sheaths passed x2 via modified Seldinger technique to the axillary vein. Following aspiration and flushing of these sheaths, they were used to introduce Medtronic model 5086 pacing lead across tricuspid valve into the right ventricular outflow tract, then withdrawn and advanced to the region of the right ventricular apex with tip directed towards the septum confirmed in an ALO projection. Fixation screw was deployed. Adequate lead sac was applied and confirmed with deep inspiration. The lead was affixed in the prepectoralis fascia following exclusion of diaphragmatica stimulation with 10 volt of output and with lead parameters as follows: R waves equal 16.1 millivolts, slew rate 3.2 volts per second, impedance 982 ohms and threshold to 0.7 volts at 0.5 milliseconds. Lead was affixed in the prepectoralis fascia with two separate 0 silk ties and tested for contraction and confirmed stability. The atrial lead passed over the more superior. J-tip guide wire was deployed in the region of the anterolateral right atrium with adequate lead sac applied and confirmed with deep inspiration. No stimulation of diaphragm with 10 volts of output. P wave assessed at 3.5 millivolts. Slew rate at 1.6 volts per second, impedance 900 ohms and threshold of 1.5 volts at 0.5 milliseconds. The lead was then affixed to the prepectoralis fascia with two separate 0 silk ties and tested with gentle traction to confirm stability. The Gentamicin soaked radiopaque gauze was then removed from the pocket and the pocket was flushed with copious amounts of gentamicin. Hemostasis was confirmed to be excellent. The lead was connected to the Medronic  RVDR01 pulse generator with visual confirmation of impedance completed through the pin blocks. Screws tightened  and leads tested with gentle traction to confirm stability and reconfirmation of pins completely through the pin block. Leads were then carefully positioned on the posterior aspect of the device and placed in the pocket with the header facing medially. The incision was then closed with running layers of 2-0 and 3-0 Vicryl. 4-0 V-Loc was used for the subcuticular layer. Steri-strips and sterile dressing were applied. No pressure dressing was applied as hemostasis was excellent. The system was viewed under fluoroscopy to confirm stable lead position and pocket position.   SUMMARY OF IMPLANTED HARDWARE: Pulse generator is a Medtronic RVDR01 serial number [id number] H with right ventricular lead Medtronic 5086-58 cm. serial number [id number] V and right atrial lead Medtronic 5086-52 cm. serial number [id number] V.    SUMMARY OF PROGRAMMED PARAMETERS: Bradypacing mode is AAI with DDD backup, lower rate of 60, upper tracking rate of 130, paced AV delay 180, sensed AV delay 150 programmed output 3.5 volts at 0.4 milliseconds in both leads with a sensitivity of 0.3 millivolts in the atrium and 0.9 volts in the right ventricle.    ____________________________ Brion Alimentonald D. Shanica Castellanos, MD ddh:ap D: 11/10/2011 10:22:55 ET T: 11/10/2011 11:13:22 ET JOB#: [id number]  cc: Brion Alimentonald D. Christin FudgeHegland, MD, <Dictator> Lamar BlinksBruce J. Kowalski, MD Verne GrainNALD D Corry Ihnen MD ELECTRONICALLY SIGNED 12/12/2011 14:02

## 2015-01-09 LAB — BASIC METABOLIC PANEL
ANION GAP: 9 (ref 7–16)
BUN: 45 mg/dL — AB
CREATININE: 1.61 mg/dL — AB
Calcium, Total: 8.7 mg/dL — ABNORMAL LOW
Chloride: 98 mmol/L — ABNORMAL LOW
Co2: 35 mmol/L — ABNORMAL HIGH
EGFR (African American): 44 — ABNORMAL LOW
EGFR (Non-African Amer.): 38 — ABNORMAL LOW
Glucose: 163 mg/dL — ABNORMAL HIGH
Potassium: 4.3 mmol/L
Sodium: 142 mmol/L

## 2015-01-09 LAB — CBC WITH DIFFERENTIAL/PLATELET
Basophil #: 0 10*3/uL (ref 0.0–0.1)
Basophil %: 0.1 %
EOS ABS: 0 10*3/uL (ref 0.0–0.7)
EOS PCT: 0 %
HCT: 37 % — ABNORMAL LOW (ref 40.0–52.0)
HGB: 11.4 g/dL — AB (ref 13.0–18.0)
LYMPHS PCT: 2.3 %
Lymphocyte #: 0.2 10*3/uL — ABNORMAL LOW (ref 1.0–3.6)
MCH: 27.6 pg (ref 26.0–34.0)
MCHC: 30.8 g/dL — ABNORMAL LOW (ref 32.0–36.0)
MCV: 90 fL (ref 80–100)
Monocyte #: 0.3 x10 3/mm (ref 0.2–1.0)
Monocyte %: 3.4 %
Neutrophil #: 9.5 10*3/uL — ABNORMAL HIGH (ref 1.4–6.5)
Neutrophil %: 94.2 %
Platelet: 72 10*3/uL — ABNORMAL LOW (ref 150–440)
RBC: 4.13 10*6/uL — ABNORMAL LOW (ref 4.40–5.90)
RDW: 17.6 % — ABNORMAL HIGH (ref 11.5–14.5)
WBC: 10.1 10*3/uL (ref 3.8–10.6)

## 2015-01-09 LAB — CULTURE, BLOOD (SINGLE)

## 2015-01-15 NOTE — Consult Note (Signed)
Brief Consult Note: Diagnosis: Consulted for isolated increased in total bili. After I was called with the consult I odered direct bili from the lab. The patient has no abd pain and no history of liver issues.   Patient was seen by consultant.   Consult note dictated.   Comments: Increased Bili. I ordered direct bili which showed it to be 1.4 with indirect of 2.2.  Haptoglobin pending. May be Gilbert's disease. This patient has prodominant increased uncongegated bili and no sign of liver disease. Follow Hb and consider hemolysis if haptoglobin comes in less then 50.  No further workup for this patient at this time.  Electronic Signatures: Midge MiniumWohl, Birdia Jaycox (MD)  (Signed 17-Apr-16 16:48)  Authored: Brief Consult Note   Last Updated: 17-Apr-16 16:48 by Midge MiniumWohl, Markelle Asaro (MD)

## 2015-01-15 NOTE — H&P (Signed)
PATIENT NAME:  Zada GirtWARD, Robbert P MR#:  [id number] DATE OF BIRTH:  1927-03-04  DATE OF ADMISSION:  01/01/2015  PRIMARY CARE PHYSICIAN: John B. Danne HarborWalker III, M.D.   REFERRING PHYSICIAN: Su Leyobert L. Kinner, M.D.   CHIEF COMPLAINT: Generalized weakness for several days.   HISTORY OF PRESENT ILLNESS: An 81 year old, Caucasian male, with a history of hypertension, CHF, and atrial fibrillation was sent from the nursing home to the ED due to generalized weakness for several days. The patient is alert, awake, but is demented, unable to provide information. According to the patient's wife and Dr. Cyril LoosenKinner, the patient was found to have generalized weakness and unable to ambulate. In addition, the patient has a fever and hypoxia. The patient's O2 saturation decreased to 86% in ED. The patient was diagnosed with a UTI three days ago and was started with p.o. cipro. The patient complains of dysuria, urinary frequency and urinary incontinence. The patient denies any shortness of breath, cough or sputum. According to the patient's wife, the patient has some trouble breathing. In addition, the patient has bilateral lower extremity swelling and weight gain. The patient's chest x-ray showed congestive heart failure. Urinalysis is pending. The patient is being treated with Zosyn in the ED.   PAST MEDICAL HISTORY: Chronic diastolic CHF, atrial fibrillation, hypertension, CAD, CKD with baseline creatinine 1.6, BPH, bovine aortic valve, mitral valve regurgitation, osteoarthritis, pancreatic mass, diverticulitis, OSA, GERD.   PAST SURGICAL HISTORY: Left hip replacement, back surgery, aortic valve replacement.   SOCIAL HISTORY: Nursing home resident since this January, quit smoking 30 years ago. No alcohol or illicit drugs.   FAMILY HISTORY: Mother had congestive heart failure and CAD. Father had CAD.   ALLERGIES: CELEBREX, PHENERGAN, VICODIN.    HOME MEDICATIONS: Medication reconciliation list is not complete yet.   REVIEW OF  SYSTEMS:   CONSTITUTIONAL: The patient has a fever, chills. No headache or dizziness, but has generalized weakness and poor intake.  EYES: No double vision or blurred vision.  ENT: No postnasal drip, slurred speech or dysphasia.  CARDIOVASCULAR: No chest pain, palpitations, orthopnea, or nocturnal dyspnea, but has leg edema and body weight gain. PULMONARY: No cough, sputum, but has shortness of breath. No hemoptysis or wheezing.  GASTROINTESTINAL: No abdominal pain, nausea, vomiting, or diarrhea, no melena or bloody stool.  GENITOURINARY: Positive for dysuria, urinary frequency and incontinence. No hematuria.  ENDOCRINE: No polyuria, polydipsia, heat or cold intolerance.  HEMATOLOGY: No easy bruising or bleeding.  NEUROLOGY: No syncope, loss of consciousness, or seizure.   PHYSICAL EXAMINATION: VITAL SIGNS: Temperature 101.3, blood pressure 113/77, pulse 128 and now decreased to 117.  GENERAL: The patient is awake, but demented, following commands, in no acute distress, looks lethargic.  HEENT: Pupils round, equal and reactive to light and accommodation. Dry oral mucosa. Clear oropharynx.  NECK: Supple. No JVD or carotid bruit. No lymphadenopathy, no thyromegaly.  CARDIOVASCULAR: S1 and S2 irregular rate and rhythm, tachycardia, no murmurs or gallops.  PULMONARY: Bilateral air entry. No wheezing, but has bilateral basilar rales. No use of accessory muscles to breathe.  ABDOMEN: Soft. No distention, no tenderness, and no organomegaly. Bowel sounds present. Obese.  EXTREMITIES: Bilateral lower extremity edema, 1 to 2+. No clubbing or cyanosis. No calf tenderness, bilateral pedal pulses are present.  SKIN: No rash or jaundice.  NEUROLOGIC: A and O x 2, demented, follows commands. No focal deficit. Power 2/5 on bilateral lower extremities. Sensation intact.   LABORATORY DATA:  Chest x-ray showed evidence of developing CHF.  WBC 15.0, hemoglobin 11.1, platelets 141,000, glucose 118, BUN 23,  creatinine 1.65. Electrolytes are normal. Bilirubin 3.6, SGPT 11, SGOT 37.   Troponin 0.03. BNP 755.   EKG showed atrial fibrillation with RVR at 127 BPM with incomplete right bundle branch block.   Lactic acid 3.1.   Urinalysis shows RBC TNTC, WBC TNTC, nitrite is negative.   IMPRESSION:  1. Sepsis with urinary tract infection.  2. Lactic acidosis.  3. Acute on chronic diastolic congestive heart failure.  4. Hypoxia.  5. Atrial fibrillation with rapid ventricular response.  6. Hypertension.  7. Chronic kidney disease, stage III.  8. Obstructive sleep apnea.  9. Gastroesophageal reflux disease. 10. Benign prostatic hypertrophy.  11. Dementia.   PLAN OF TREATMENT:  1. The patient will be admitted to telemetry floor with continued telemonitor. The patient was treated with Zosyn and vancomycin in the ED. We will continue Zosyn and start Levaquin. Follow up blood culture, urine culture and CBC.  2. For hypoxia, we will give oxygen by nasal cannula and nebulizer treatment.  3. For acute on chronic diastolic CHF, I started Lasix 40 mg IV b.i.d. and follow up BMP, start CHF protocol.  4. For atrial fibrillation with RVR, treat underlying disease as mentioned above. In addition, we will continue Lopressor. The patient is not on any anticoagulation. The patient has a high risk of bleeding. I will request a cardiology consult.  5. The patient also has anemia and thrombocytopenia. The patient is on heparin for DVT prophylaxis, needs followup CBC.   I discussed the patient's condition and the plan of treatment with patient's wife. She said the patient wants FULL CODE.   TIME SPENT: About 66 minutes.    ____________________________ Shaune Pollack, MD qc:JT D: 01/01/2015 11:14:21 ET T: 01/01/2015 12:05:09 ET JOB#: [id number]  cc: Shaune Pollack, MD, <Dictator> Shaune Pollack MD ELECTRONICALLY SIGNED 01/01/2015 15:37

## 2015-01-15 NOTE — H&P (Signed)
PATIENT NAME:  Francisco [name], Francisco [name] MR#:  [id number] DATE OF BIRTH:  1926/09/20  DATE OF ADMISSION:  01/05/2015  REFERRING PHYSICIAN: Coolidge Breeze, MD  PRIMARY CARE DOCTOR: Letta Pate. Danne Harbor, MD   ADMISSION DIAGNOSIS: Sepsis and urinary tract infection.   HISTORY OF PRESENT ILLNESS: This is an 81 year old Caucasian male who presents to the Emergency Department via EMS from his nursing home, where he was found to have a heart rate of 100. The patient had a temperature of 99 degrees Fahrenheit at the time, but by arrival in the Emergency Department had a temperature maximum of 103 rectally. The patient was recently discharged from this hospital with a urinary tract infection, with instructions to complete antibiotics at his nursing home facility. The nursing home staff and family members noticed that the patient was sleeping most of the day since his return to the nursing home. They report that he had been eating fairly well. They deny any nausea, vomiting, or diarrhea. His wife reports that he had been coughing some, but otherwise appeared normal until this evening, at which time he presented to the Emergency Department tachycardic and with rigors. Due to his sepsis syndrome, the Emergency Department called for admission.   REVIEW OF SYSTEMS: The patient cannot contribute much to his review of systems, as he nods and only repeats most of the questions or symptoms asked of him during interviewing and examination. He denies any chest pain at this time. He also denies any shortness of breath.   PAST MEDICAL HISTORY: Benign prostatic hypertrophy, diastolic congestive heart failure, hypertension, coronary artery disease, atrial fibrillation, CKD, mitral valve regurgitation, osteoarthritis, gastroesophageal reflux disease, obstructive sleep apnea, questionable pancreatic mass, and dementia.   PAST SURGICAL HISTORY: Aortic valve repair, bilateral hip replacements, multiple back surgeries, as well as pacemaker  implantation.   SOCIAL HISTORY: The patient is a resident of [address]. He quit smoking 30 years ago. He does not drink or do any drugs.   FAMILY HISTORY: The patient has a brother who had prostate cancer. He is deceased of complications of sepsis from cellulitis.   MEDICATIONS: 1.  Aspirin 325 mg 1 tablet p.o. daily. 2.  Diphenhydramine 25 mg oral capsule 1 capsule p.o. every 6 hours as needed for allergic symptoms.  3.  Docusate sodium 100 mg 1 capsule p.o. b.i.d.  4.  Finasteride 5 mg 1 tablet p.o. daily.  5.  Geri-Lanta aluminum with magnesium hydroxide and simethicone suspension, 30 mL every 4 to 6 hours as needed for heartburn.   6.  Levofloxacin 250 mg 1 tablet p.o. every day.  7.  Loperamide 2 mg 1 capsule p.o. every 3 hours as needed for loose stool or diarrhea.  8.  Lorazepam 0.5 mg 1 tablet p.o. every 8 hours as needed for anxiety.  9.  Tylenol 500 mg 1 tablet p.o. as needed for fever.  10.  Metoprolol tartrate 50 mg 1-1/2 tablets p.o. daily.  11.  Milk of magnesia 8% oral suspension 30 mL daily at bedtime as needed for constipation.  12.  Omeprazole 20 mg 1 tablet p.o. b.i.d.  13.  ProAir high-flow inhaler 90 mcg/inhalation 2 puffs inhaled every 4 hours as needed for coughing, wheezing, or shortness of breath.  14.  Q-Tussin 100 mg per 5 mL oral solution, 100 mL every 6 hours as needed for cough. 15.  Quetiapine 50 mg 1 tablet p.o. at bedtime.  16.  Tamsulosin 0.4 mg 1 tablet p.o. daily.  17.  Torsemide 20  mg 1 tablet p.o. b.i.d. for 3 days and then 20 mg p.o. daily thereafter.  18.  Tramadol 50 mg 1 tablet p.o. b.i.d. 19.  Zolpidem 5 mg 1 tablet p.o. at bedtime for sleep.   ALLERGIES: CELEBREX, PHENERGAN, AND VICODIN.   PERTINENT LABORATORY RESULTS AND RADIOGRAPHIC FINDINGS: Serum glucose is 111, BUN is 26, creatinine is 1.38, serum sodium is 142, potassium is 3.6, chloride is 99, bicarbonate is 34, calcium is 8.3, lactic acid is 2.2, serum albumin is 2.8, alkaline  phosphatase is 108, AST is 32, ALT is 11. Troponin is negative. White blood cell count is 3.9, hemoglobin is 11.1, hematocrit is 35.5, platelet count is 100,000. MCV is 89. INR is 1.3. Chest x-ray shows cardiomegaly with atherosclerosis, pulmonary venous hypertension, chronic pleural and parenchymal scarring, as well as possible mild edema.   PHYSICAL EXAMINATION:  VITAL SIGNS: Temperature is 102.4, pulse 62, respirations 20, blood pressure 108/65, pulse oximetry is 90% on 2 L of oxygen via nasal cannula.  GENERAL: The patient is alert, but he does not appear to be oriented more than to person. He is visibly shaking. His wife states that he has a resting tremor at baseline, but that this appears worse and is very much likely rigors and chills.  HEENT: Normocephalic, atraumatic. Pupils equal, round, and reactive to light and accommodation. Extraocular movements are intact. Mucous membranes are moist.  NECK: Trachea is midline. Thyroid is not palpable, nontender.  CHEST: Symmetric and atraumatic.  CARDIOVASCULAR: Regular rate and rhythm. Normal S1, S2. There is a 3/6 systolic ejection murmur heard best over the mitral valve area as well as a 2/6 murmur heard best over the aortic valve area radiating to the left axilla.  LUNGS: Clear to auscultation bilaterally. Normal effort and excursion.  ABDOMEN: Positive bowel sounds. Soft, nontender, nondistended. No hepatosplenomegaly.  GENITOURINARY: Deferred.  MUSCULOSKELETAL: The patient moves all 4 extremities equally. The patient does not cooperate with strength testing.  SKIN: No rashes or lesions. It is warm and dry.  EXTREMITIES: No clubbing, cyanosis. The patient does have 2+ pitting edema of his lower extremities to the shins.  NEUROLOGIC: Cranial nerves II through XII are grossly intact.  PSYCHIATRIC: Mood is normal. Affect is congruent. The patient does not have any insight into his medical condition and it is difficult to assess judgment, as he does  not directly answer my questions coherently.   ASSESSMENT AND PLAN: This is an 81 year old male admitted for sepsis and urinary tract infection.  1.  Sepsis. The patient meets criteria via fever and tachycardia. He was recently discharged with a urinary tract infection. The urine culture from that admission grew Staphylococcus epidermidis. He is currently hemodynamically stable. We have drawn blood cultures as well as urine cultures yet again. He was given Levaquin, vancomycin, and Zosyn in the Emergency Department. We will continue the latter 2 antibiotics for full-spectrum coverage.  2.  Urinary tract infection. We will continue antibiotics as described. This is likely a complication of benign prostatic hypertrophy as well as dementia. 3.  Benign prosthetic hypertrophy. We will continue finasteride and tamsulosin.  4.  Congestive heart failure. It is reportedly diastolic. I do not have an echo report at this time. However, he has no respiratory distress or pulmonary edema on physical examination.  5.  Hypertension. His blood pressure is trending downward. I have started the patient with some gentle hydration. We will continue metoprolol in the morning if his blood pressure allows. He appears to be fluid responsive  and thus does not indicate that he has signs or symptoms of endotoxic shock.  6.  Coronary artery disease, stable. We will continue aspirin.  7.  Atrial fibrillation. The patient is not on any systemic anticoagulation, likely due to risk-benefit ratio, namely secondary to falls. 8.  Chronic kidney disease. This is stable at stage 3. We will try to avoid nephrotoxic agents.  9.  Pancreatic mass. I will need to follow up on the status or pathology of mass if it exists.  10.  Dementia. We will continue quetiapine. I have held the patient's Ativan, as this may worsen his mental status.  11.  Deep vein thrombosis prophylaxis. Heparin.  12.  Gastrointestinal prophylaxis. None.  CODE STATUS: The  patient is still a full code. I suggested to his wife that we rethink that code status given his multiple medical problems as well as quality of life going forward.  TIME SPENT: On admission orders and critical patient care is approximately 45 minutes.    ____________________________  Kelton Pillar. Sheryle Hail, MD msd:ST D: 01/05/2015 22:22:18 ET T: 01/06/2015 00:05:07 ET JOB#: [id number]  cc: Kelton Pillar. Sheryle Hail, MD, <Dictator> Kelton Pillar Monquie Fulgham MD ELECTRONICALLY SIGNED 01/08/2015 3:35

## 2015-01-15 NOTE — Consult Note (Signed)
PATIENT NAME:  Francisco [name], Francisco [name] MR#:  [id number] DATE OF BIRTH:  [birth year]  DATE OF CONSULTATION:  09/19/2014  REFERRING PHYSICIAN:  Santina Evans P. Clent Ridges, MD CONSULTING PHYSICIAN:  Aryam Zhan D. Juliann Pares, MD  PRIMARY PHYSICIAN:  Dr. Dan Humphreys.  CARDIOLOGIST: Lamar Blinks, MD   INDICATION: Chest pain and shortness of breath.   HISTORY OF PRESENT ILLNESS: The patient is an 81 year old male with past history of atrial fibrillation, bovine prosthetic valve, mitral valve regurgitation, hypertension, sleep apnea who presented after a fall at home. Patient reported he was walking across the floor, he tripped over the carpet and fell on the right side and landed on the side rails of the walker. His wife found him on the floor and helped him up. Since yesterday, he felt short of breath and significant pain in the ribs. There was no bruising. Chest CT and x-rays showed no rib fractures or pulmonary contusion, with no significant bruising or ecchymosis. Reports that over the last 2 to 3 weeks, he has gained about 30 pounds. He has had bilateral extremity edema. He has been working with his cardiologist, treated with diuretics. Patient hypoxic with sats in the 80s, so he was admitted for further evaluation and care.   PAST MEDICAL HISTORY: Diverticulitis, mitral valve regurgitation, and bovine aortic valve replacement, osteoarthritis, atrial fibrillation, coronary artery disease, history of pancreatic mass,  BPH, obstructive sleep apnea, reflux, hypertension.   PAST SURGICAL HISTORY: Hip replacement, back surgery, aortic valve placement.   SOCIAL HISTORY: The patient lives with his wife. He uses a walker. No home oxygen. Former smoker. Drinks alcohol occasionally.   FAMILY HISTORY: Mother had congestive heart failure and coronary disease. His father had coronary disease.  ALLERGIES: TO VICODIN, CELEBREX, PHENERGAN.   REVIEW OF SYSTEMS:  Denies blackout spells or syncope. No nausea or vomiting. No fever, no  chills, no sweats. No weight loss. No hemoptysis, hematemesis. No bright red blood per rectum. He has had a recent fall, fatigue, weight gain, shortness of breath, but no chest pain.   MEDICATIONS:  He is on vitamin B12,  tamsulosin 0.4 once a day, omeprazole 20 a day, omega-3 once a day, MiraLax 17 grams as needed, metoprolol 100 mg once a day, ibuprofen 200 mg 2 tablets every 6 hours, finasteride 5 mg a day, Centrum once a day, amoxicillin once a day and Tylenol every 6 hours as needed.   PHYSICAL EXAMINATION:  VITAL SIGNS: Blood pressure 120/80, pulse 85, respiratory rate 16, afebrile.  HEENT: Normocephalic, atraumatic. Pupils equal and reactive to light.  NECK:  Supple. No significant JVD, bruits or adenopathy.  LUNGS: Clear to auscultation, no wheezing, rhonchi or rales.  HEART:  Normal rate and rhythm. ABDOMEN:  Bowel sounds. No rebound, guarding or tenderness.  EXTREMITY: Within normal limits.  NEUROLOGIC: Intact.  SKIN: Normal.   LABORATORY DATA:   Sodium 139, potassium 4.1, chloride 95, BUN 34, bicarbonate 36, creatinine 2.02, BNP 2849. LFTs were normal. Troponin 0.02. White count is 6700, hemoglobin 12.8, platelet count 116,000.  A CT of the chest shows no fractures. Chest x-ray essentially stable as well for COPD, bronchitis, changes.   ASSESSMENT: Anasarca, acute on chronic renal failure, atrial fibrillation, aortic valve replacement, mitral regurgitation, hypertension, shortness of breath, recent fall with pain.   PLAN: Agree with admit. Pain control. Diuresis. Follow up renal insufficiency. Consider nephrology input. Physical therapy to help with gait training to prevent further falls. Continue rate control for atrial fibrillation. Do not recommend anticoagulation because of fall  risk. For gastroesophageal reflux disease, continue omeprazole therapy. Continue tamsulosin for benign prostatic hypertrophy symptoms. Continue hypertension control. Echocardiogram for valve and left  ventricular function evaluation. Continue to treat the patient conservatively from a cardiac standpoint. Have the patient follow up with Dr. Gwen PoundsKowalski upon discharge.     ____________________________ Bobbie Stackwayne D. Juliann Paresallwood, MD ddc:kl D: 09/21/2014 08:41:00 ET T: 09/21/2014 10:13:43 ET JOB#: [id number]  cc: Morgaine Kimball D. Juliann Paresallwood, MD, <Dictator> Alwyn PeaWAYNE D Quilla Freeze MD ELECTRONICALLY SIGNED 10/09/2014 8:47

## 2015-01-15 NOTE — H&P (Signed)
PATIENT NAME:  Francisco [name], Francisco [name] MR#:  [id number] DATE OF BIRTH:  1926/11/01  DATE OF ADMISSION:  09/18/2014  PRIMARY CARE PROVIDER: Dr. Dan Humphreys with [facility].   PRIMARY CARDIOLOGIST: Dr. Gwen Pounds.   REFERRING EMERGENCY ROOM PHYSICIAN: Phineas Semen, MD.   CHIEF COMPLAINT: Pain in the right ribs after a fall.   HISTORY OF PRESENT ILLNESS: This very pleasant, 81 year old man with past medical history of atrial fibrillation, bovine prosthetic aortic valve, mitral valve regurgitation, hypertension and sleep apnea who presents today 24 hours after a fall in his home. He reports that he was walking across the floor. He tripped over the carpet, fell to the right side and landed on the side rail of his walker. His wife found him on the floor and helped him up. Since yesterday, he has felt short of breath and had significant pain in the right ribs. There is no bruising. Chest x-ray and CT scan of the chest showed no rib fractures or pulmonary contusion. There is no bruising. He also notes that over the past 3 to 4 weeks, he has gained approximately 30 pounds. He has bilateral lower extremity edema. He has been working with his cardiologist on this and has had a trial of 3 different diuretics. He is hypoxic in the emergency room with oxygen saturations in the high 80, 87% on room air. Hospitalist services are asked for further evaluation and treatment.   PAST MEDICAL HISTORY: 1. Recent episode of diverticulitis about 1 month ago.  2. Mitral valve regurgitation.  3. Bovine aortic valve.  4. Osteoarthritis.  5. Atrial fibrillation  6. Coronary artery disease.  7. History of pancreatic mass.  8. Benign prostatic hypertrophy.  9. Obstructive sleep apnea.  10. Gastroesophageal reflux disease.  11. Hypertension.   PAST SURGICAL HISTORY: 1. Left total hip replacement.  2. Back surgery.  3. Aortic valve replacement.   SOCIAL HISTORY: The patient lives with his wife. He uses a walker for ambulation. He  is not on home oxygen. He is a former smoker; quit about 30 years ago. He drinks alcohol occasionally. No illicit substance abuse.   FAMILY MEDICAL HISTORY: His mother had congestive heart failure and coronary artery disease. His father had coronary artery disease.   ALLERGIES: HE IS ALLERGIC TO VICODIN CELEBREX AND PHENERGAN.    REVIEW OF SYSTEMS: CONSTITUTIONAL: Negative for fevers or chills. Positive for fatigue, weakness, and weight gain of 30 pounds over the past month.   HEENT: No change in vision or hearing. No pain in eyes or ears. No sore throat or difficulty swallowing. No sinus congestion.   RESPIRATORY: Positive for shortness of breath with exertion. No cough, wheezing or hemoptysis. No COPD.   CARDIOVASCULAR: No chest pain. Positive for edema and orthopnea. No palpitations. No syncope.   GASTROINTESTINAL: No nausea, vomiting, diarrhea or abdominal pain.   GENITOURINARY: No dysuria or frequency.   MUSCULOSKELETAL: No new pain in the shoulders, knees or hips. There is new pain in the right back after fall. He does have a history of arthritis. No gout.   NEUROLOGIC: No focal numbness or weakness. No dysarthria. No dementia, CVA or seizure.   PSYCHIATRIC: No bipolar disorder or schizophrenia.   HEMATOLOGIC: No easy bruising or bleeding.  MEDICATIONS:  1. Vitamin B12 1 tablet daily.  2. Tamsulosin 0.4 mg 1 capsule once a day.  3. Omeprazole 20 mg 1 tablet twice a day.  4. Omega-3 one tablet once a day.  5. MiraLax 17 grams twice a day  as needed for constipation.  6. Metoprolol 100 mg 1 tablet once a day.  7. Ibuprofen 200 mg 2 tablets every 6 hours as needed for pain.  8. Finasteride 5 mg 1 tablet once a day.  9. Centrum antioxidant 1 tablet once a day.  10. Amoxicillin 500 mg 1 tablet once a day prior to dental appointment.  11. Acetaminophen/tramadol 325/37.5 one tablet every 6 hours as needed for pain.   PHYSICAL EXAMINATION: VITAL SIGNS: Temperature 97.6, pulse  96, respirations 18, blood pressure 118/85, oxygenation 96% on 2 liters nasal cannula.   HEENT: Pupils equal, round and reactive to light. Conjunctivae are clear. Extraocular motion is intact. Oral mucous membranes pink and moist. Good dentition. Posterior oropharynx is clear. No exudate, erythema or edema.   NECK: Supple. Trachea midline. No cervical lymphadenopathy.   RESPIRATORY: There are bibasilar crackles with good air movement. No respiratory distress at rest.   CARDIOVASCULAR: Irregular, rate controlled. There is a 3/6 diastolic blowing murmur. There is 2+ pitting edema to the belly button, which is symmetric. Peripheral pulses are 1+.   ABDOMEN: Soft, nontender, nondistended. Bowel sounds are normal. No guarding. No rebound. No hepatosplenomegaly or mass.   SKIN: No open lesions. No bruises blisters. No blisters or weeping.   MUSCULOSKELETAL: No joint effusions. Range of motion normal. Strength 5/5 throughout.   NEUROLOGIC: Cranial nerves II through XII grossly intact. Strength and sensation are intact and equal. Tone is normal.   PSYCHIATRIC: The patient is alert and oriented x 4 with good insight into his clinical condition.   LABORATORY DATA: Sodium 139, potassium 4.1, chloride 95, bicarbonate 36, BUN 34, creatinine 2.02. BNP 2,849. LFTs normal with a slightly elevated bilirubin at 1.4. CK is 33. Troponin less than 0.02. White blood cells 6.7, hemoglobin 12.8, platelets 116,000, MCV is 96.   IMAGING:  1. CT scan of the chest shows no acute fracture or other acute findings in the chest. Stable calcified pleural plaques.  2. Chest x-ray shows stable cardiac enlargement with chronic bronchitic changes and calcified pleural plaques.   ASSESSMENT AND PLAN: 1. Anasarca. The patient reports he has been working with his cardiologist on his edema. He has tried 3 different diuretics without significant improvement. He reports that he has gained 30 pounds over the past 3 to 4 weeks. He is  on a low-sodium diet. We will check daily weights and monitor inputs and outputs. He reports that he had a 2-D echocardiogram by his cardiologist this week, so I will not repeat that study. I will consult his cardiologist to see him in the hospital. He does have acute renal failure today. I will check a urinalysis and check an albumin and consult nephrology to assist with volume. His hepatic function is normal. He is on several medications that could lead to fluid retention, though rarely this severe. These include ibuprofen and metoprolol.  2. Acute renal failure. The patient's creatinine has been increasing over time and has gone from 1.56 to 2 over the past month. We will get a urinalysis and consult nephrology. We will provide gentle hydration to preserve renal function but this may contribute to his anasarca.  3. Atrial fibrillation: Rate is controlled at this time. He does not seem to be on any anticoagulation.  4. Bovine aortic valve replacement, mitral valve regurgitation. He follows with his cardiologist for this.  5. Hypertension: Blood pressure is well controlled at this time. We will continue home regimen.  6. Deep vein thrombosis prophylaxis: Heparin for  deep vein thrombosis prophylaxis. No gastrointestinal prophylaxis at this time.   TIME SPENT ON ADMISSION: 45 minutes.     ____________________________ Ena Dawleyatherine P. Clent RidgesWalsh, MD cpw:TT D: 09/18/2014 19:28:12 ET T: 09/18/2014 19:49:36 ET JOB#: [id number]  cc: Santina Evansatherine P. Clent RidgesWalsh, MD, <Dictator> Gale JourneyATHERINE P Wyatt Thorstenson MD ELECTRONICALLY SIGNED 09/27/2014 13:17

## 2015-01-15 NOTE — Consult Note (Signed)
PATIENT NAME:  Francisco [name], Francisco [name] MR#:  [id number] DATE OF BIRTH:  November 14, 1926 DATE OF ADMISSION:  01/01/2015  GASTROENTEROLOGY CONSULTATION  DATE OF CONSULTATION:  01/01/2015.  CONSULTING SERVICE:  Gastroenterology.  CONSULTING PHYSICIAN:  Midge Miniumarren Pennye Beeghly, MD.  REASON FOR CONSULTATION:  Increased bilirubin.   HISTORY OF PRESENT ILLNESS:  This patient is an 81 year old gentleman who was admitted with generalized weakness for the last several days.  The patient has a history of a UTI 3 days ago and was started on [name].o. ciprofloxacin.  The patient complains of dysuria, urinary frequency, and urinary incontinence.  The patient was admitted to the hospital and was found to have isolated increased bilirubin and a GI consult was called.  At that time, the admitting physician did not order a direct bilirubin; just a total bilirubin.  The AST and ALT, including the alkaline phosphatase, were not elevated.   PAST MEDICAL HISTORY:  CHF, hypertension, atrial fibrillation, aortic valve replacement, mitral valve regurgitation, osteoarthritis, diverticulitis.   ALLERGIES:  CELEBREX, PHENERGAN, VICODIN.   FAMILY HISTORY:  Noncontributory.   SOCIAL HISTORY:  Quit smoking 30 years ago.  No alcohol or drug abuse.   HOME MEDICATIONS:   1. Tramadol.  2.   Diphenhydramine  3. ProAir  4. Zolpidem.   5. Tamsulosin.   6. Omeprazole.   7. Milk of magnesia.  8. Metoprolol.  9. Lorazepam.   10.  Loperamide.  11.  Finasteride.   12.  Docusate.   13. Cipro.   REVIEW OF SYSTEMS:  Ten-point review of systems negative except what was stated above.   PHYSICAL EXAMINATION:  GENERAL:  The patient lying in bed in no apparent distress.  VITAL SIGNS:  Temperature 97.2, pulse 110, respirations 18, blood pressure 102/59, pulse oximetry 99%.  HEENT:  Normocephalic, atraumatic.  Extraocular motor intact.  Pupils equally round and reactive to light and accommodation.  NECK:  Without JVD, without lymphadenopathy.  LUNGS:   Clear to auscultation bilaterally.  HEART:  Tachycardia.  Without rubs or gallops.  S1 and S2 heard.  ABDOMEN:  Soft, nontender, nondistended, without hepatosplenomegaly.  EXTREMITIES:  Without clubbing or cyanosis; +1 pitting edema.  SKIN:  Without any rashes or lesions.  NEUROLOGICAL:  Grossly intact.   LABORATORY DATA:  As stated above, bilirubin 3.6 with AST of 37, ALT 11, alkaline phosphatase of 117.     ASSESSMENT AND PLAN:  This patient is an 81 year old gentleman who had an admission with a urinary tract infection and weakness, who was found to have an isolated increased total bilirubin.  The admitting physician did not fractionate it which I added on to the patient's labs when I had received the consultation.  As expected, the patient's unconjugated bilirubin was 2.2 with the direct being 1.4, thereby making this mostly unconjugated hyperbilirubinemia.  The most likely cause is Gilbert's syndrome but may also be due to hemolysis.  I have also ordered a haptoglobin.  There is no indication for any further liver workup for an isolated bilirubin mostly unconjugated.  If it is Gilbert's, the patient's stress of his urinary tract infection could explain the increase.   Thank you very much for involving me in the care of this patient.  If you have any questions, please do not hesitate to call.     ____________________________ Midge Miniumarren Nyan Dufresne, MD dw:kc D: 01/01/2015 18:37:53 ET T: 01/01/2015 19:03:04 ET JOB#: [id number]  cc: Midge Miniumarren Aziah Kaiser, MD, <Dictator> Midge MiniumARREN Kehaulani Fruin MD ELECTRONICALLY SIGNED 01/03/2015 7:19

## 2015-01-15 NOTE — Discharge Summary (Signed)
PATIENT NAME:  Francisco [name], Francisco [name] MR#:  [id number] DATE OF BIRTH:  [birth year]  DATE OF ADMISSION:  01/05/2015 DATE OF DISCHARGE:  01/10/2015  ADMITTING DIAGNOSES: Sepsis, urinary tract infection.   DISCHARGE DIAGNOSES:  1.  Clinical sepsis with urine culture only growing coagulate-negative Staphylococcus, unclear cause for this sepsis. The patient did not improve. 2.  Rapid atrial fibrillation with rapid ventricular response.  3.  Hypotension related to sepsis.  4.  Acute respiratory failure with hypoxia due to fluid overload with acute congestive heart failure.  5.  Benign prostatic hypertrophy.  6.  Chronic kidney disease.  7.  Gastroesophageal reflux disease.  8.  Dementia.  CONSULTANTS: Palliative care.   HISTORY OF PRESENT ILLNESS:  Please refer to evaluation for details.  LABORATORY DATA: Glucose 118, BUN was 23, creatinine 1.65, sodium 138, potassium 4.0, chloride 98, CO2 was 28, calcium was 8.6. Lactic acid was 3.1. LFTs showed an albumin of 3.3, bilirubin total 3.6, bilirubin total 1.4, troponin 0.03. WBC 15, hemoglobin 11.1, platelet count was 141,000.  Urine culture:  75,000 colonies of coagulase negative Staphylococcus.  Blood cultures: No growth.   HOSPITAL COURSE: Please refer to H and [name] done by the admitting physician. The patient is an 81 year old male with dementia who was in a nursing home. He has at least 3 recent hospitalizations and 7 ER visits within the past 6 months, who was brought in with decrease in responsiveness. He was tachycardic, was having fevers, rigors.  Due to this, he was admitted. The patient was treated aggressively with IV antibiotics. He was also starting to have respiratory difficulties. There was concern for acute CHF as well as aspiration pneumonia. Despite being aggressive with his care, he failed to show much improvement. He was seen by palliative care and was made DNR, and finally it was decided to discharge him to hospice home. Family was in full  agreement for his discharge to the hospice home.   DISCHARGE MEDICATIONS: Flomax 0.4 daily, lorazepam 0.5 1 to 2 tablets every 2 to 4 hours as needed, morphine 0.25 mL q. 1 to 2 hours as needed, Zofran 4 mg every 6 hours as needed.   Time spent on this discharge is 35 minutes.   ____________________________ Lacie ScottsShreyang H. Allena KatzPatel, MD shp:sp D: 01/11/2015 14:17:21 ET T: 01/11/2015 17:10:52 ET JOB#: [id number]  cc: Alanzo Lamb H. Allena KatzPatel, MD, <Dictator> Charise CarwinSHREYANG H Cellie Dardis MD ELECTRONICALLY SIGNED 01/13/2015 14:42

## 2015-01-15 NOTE — Discharge Summary (Signed)
PATIENT NAME:  Francisco [name], [name] MR#:  [id number] DATE OF BIRTH:  1927/02/26  DATE OF ADMISSION:  01/01/2015 DATE OF DISCHARGE:  01/03/2015  PRESENTING COMPLAINT: Weakness and not feeling well.   DISCHARGE DIAGNOSES: 1. Urinary tract infection.  2. Acute on chronic diastolic heart failure.  3. Dementia.  4. Saturations is 91% on room air.  5. Full code.   MEDICATIONS: 1. Omeprazole 1 capsule b.i.d.  2. Docusate 100 mg b.i.d.  3. Quetiapine 50 mg p.o. at bedtime.  4. Metoprolol tartrate 75 mg p.o. daily.  5. ProAir HFA 2 puffs every 4 hours.  6. Tramadol 50 mg 1 tablet b.i.d.  7. Ambien 5 mg p.o. daily at bedtime.  8. Diphenhydramine 25 mg 1 capsule every 6 hours as needed.  9. Geri-Lanta  30 mL 4 times a day as needed for heartburn.  10. Loperamide 2 mg 1 capsule 3 times a day as needed.  11. Lorazepam 0.5 mg t.i.d. as needed.  12. Mapap 500 mg 1 tablet every 4 hours as needed.  13. Milk of magnesia 30 mL p.o. daily at bedtime as needed.  14. Q-Tussin 10 mL every 6 hours as needed.  15. Finasteride 5 mg p.o. daily.  16. Tamsulosin 0.4 mg p.o. daily.  17. Torsemide 20 mg 1 tablet b.i.d. for 3 days, then 20 mg p.o. daily.  18. Aspirin 325 mg p.o. daily.  19.  Levaquin 250 p.o. every 4 daily.   FOLLOWUP:  1. Follow up with Dr. Hayden PedroJ. B. Walker, [facility], in 1-2 weeks.  2. [facility] in 2 weeks.   CONSULTATIONS:  1. Cardiology, Dr. Juliann Paresallwood.  2. GI, Dr. Servando SnareWohl.   BRIEF SUMMARY OF HOSPITAL COURSE: Mr. [name] is an 81 year old Caucasian gentleman with past medical history of hypertension and chronic diastolic heart failure, comes in with:  1. Sepsis suspected due to urinary tract infection, along with lactic acidosis. He was started initially on IV Zosyn and vancomycin, changed to p.o. Levaquin. Blood cultures remained negative, sepsis resolved.  2. Acute congestive heart failure, diastolic with hypoxia and received oxygen,  nebulizers, Lasix 40 mg IV b.i.d., diuresed well, changed to p.o. Lasix. Will follow up in outpatient CHF clinic. He was also seen by Dr. Juliann Paresallwood.  3. Chronic atrial fibrillation with rapid ventricular response. Continued Lopressor and not on any high anticoagulation secondary to high risk for bleeding.  4. Chronic anemia and thrombocytopenia. No active bleeding. Heparin was discontinued.  5. Dementia. Per wife, the patient's dementia has been progressive and a Child psychotherapistsocial worker [facility]. He will be placed in the memory unit. Physical therapy was recommended and has been arranged.   TIME SPENT: 40 minutes.     ____________________________ Wylie HailSona A. Allena KatzPatel, MD sap:mw D: 01/04/2015 11:31:15 ET T: 01/04/2015 13:21:58 ET JOB#: [id number]  cc: Valiant Dills A. Allena KatzPatel, MD, <Dictator> Dwayne D. Juliann Paresallwood, MD Letta PateJohn B. Danne HarborWalker III, MD Midge Miniumarren Wohl, MD  Willow OraSONA A Ayson Cherubini MD ELECTRONICALLY SIGNED 01/13/2015 14:46

## 2015-01-15 NOTE — Consult Note (Signed)
Admit Diagnosis:   HYPOXIA: Onset Date: 19-Sep-2014, Status: Active, Description: HYPOXIA   General Aspect Volume overload and Acute [place] Injury   Present Illness Francisco [name] is a pleasant 81 yo gentleman with h/o AoVR, MVR and chronic pain who is being seen at the request of Dr. Volanda Napoleon to evaluate and provide recommendations for acute kidney injury. Francisco [name] has been trying to come off the tramadol he takes for pain b/c he thought since he felt being on the same drug for too long would be 'bad for him'. In it's place, he's been taking ibuprofen 443m 3-4 times daily for the past 2-3 weeks. In addition, he has been recovering from a bout of diverticulitis diagnosed in December. He was doing well, until he suffered a fall on Jan 2nd, after having tripped over a transition from hard wood to carpet in his home. His wife had noted that his legs had started to swell over the last couple of weeks and then yesterday he grew increasingly SOB, so she brought him to the ED where he was found to have low O2 sats and hence was admitted.   [name] has trended as follows:  [facility] in [facility] ER was negative for infiltrate or edema. Renal U/S was negative for obstruction. UA was negative for blood but positive for protein (336mdl) and has 18 hyaline casts/HPF, 12 WBC/HPF.   Home Medications: Medication Instructions Status  MiraLax oral powder for reconstitution 17 gram(s) in 8oz of fluid and drink orally 2 times a day as needed for constipation. Active  omeprazole 20 mg oral delayed release capsule 1 cap(s) orally 2 times a day Active  tamsulosin 0.4 mg oral capsule 1 cap(s) orally once a day Active  amoxicillin 500 mg oral capsule 1 cap(s) orally 1 hour prior to dental appointments Active  acetaminophen-tramadol 325 mg-37.5 mg oral tablet 1 tab(s) orally every 6 hours as needed for pain. Active  finasteride  5 mg oral tablet 1 tab(s) orally once a day Active  Metoprolol Tartrate 100 mg oral tablet 1 tab(s) orally once a day Active  omega-3 polyunsaturated fatty acids - oral capsule 1 cap(s) orally once a day Active  Centrum Antioxidant Multiple Vitamins and Minerals oral tablet 1 tab(s) orally once a day Active  Vitamin B2 1 tab(s) orally once a day Active  ibuprofen 200 mg oral tablet 2 tabs (40076morally every 6 hours as needed for pain alternating with tramadol. Active    Vicodin: N/V/Diarrhea  Phenergan: Other  Celebrex: Other  Case History and Physical Exam:  Chief Complaint Shortness of Breath   Past Medical Health Coronary Artery Disease, Hypertension, MV regurgitation, AFib, OSA, BPH, GERD, HTN, pancreatic mass,   Past Surgical History aortic valve replacement (bovine), left hip replacement, back surgery   Primary Care Provider Other  JohLisette GrinderFamily History Other  No history of kidney disease   Neck/Nodes Supple  No Adenopathy   Chest/Lungs Clear   Cardiovascular Irregular Rate and Rhythm  4+ edema B/L to knees   Abdomen Benign   Neurological Grossly WNL   Skin Warm  Dry   Nursing/Ancillary Notes: **Vital Signs.:   04-Jan-16 00:05  Vital Signs Type Routine  Temperature Temperature (F) 98.5  Celsius 36.9  Temperature Source oral  Pulse Pulse 95  Respirations Respirations 19  Systolic BP Systolic BP 135381iastolic BP (mmHg) Diastolic BP (mmHg) 96  Mean BP 109  Pulse Ox % Pulse Ox % 96  Pulse Ox Activity Level  At rest  Oxygen Delivery 2L; Nasal Cannula    08:53  Vital Signs Type Routine  Temperature Temperature (F) 97.7  Celsius 36.5  Temperature Source oral  Pulse Pulse 122  Respirations Respirations 20  Systolic BP Systolic BP 878  Diastolic BP (mmHg) Diastolic BP (mmHg) 71  Mean BP 83  Pulse Ox % Pulse Ox % 94  Pulse Ox Activity Level  At rest  Oxygen Delivery 2L; Nasal Cannula    08:59  Pulse Pulse 99  Pulse source if not from Vital Sign  Device per Telemetry Clerk; A-FIB WITH BBB  Telemetry pattern Cardiac Rhythm Atrial fibrillation; Bundle Branch Block    15:29  Vital Signs Type Routine  Temperature Temperature (F) 97.8  Celsius 36.5  Temperature Source oral  Pulse Pulse 76  Respirations Respirations 20  Systolic BP Systolic BP 676  Diastolic BP (mmHg) Diastolic BP (mmHg) 74  Mean BP 87  Pulse Ox % Pulse Ox % 96  Pulse Ox Activity Level  At rest  Oxygen Delivery 2L; Nasal Cannula  Telemetry pattern Cardiac Rhythm Atrial fibrillation; Bundle Branch Block  *Intake and Output.:   Daily 04-Jan-16 07:00  Grand Totals Intake:   Output:  420    Net:  -62 24 Hr.:  -420  Urine ml     Out:  420  Length of Stay Totals Intake:   Output:  420    Net:  -420    Shift 15:00  Grand Totals Intake:  240 Output:  150    Net:  90 24 Hr.:  90  Oral Intake      In:  240  Urine ml     Out:  150  Length of Stay Totals Intake:  240 Output:  570    Net:  -330     Thyroid:  04-Jan-16 04:32   Thyroid Stimulating Hormone 0.883 (0.45-4.50 (IU = International Unit)  ----------------------- Pregnant patients have  different reference  ranges for TSH:  - - - - - - - - - -  Pregnant, first trimetser:  0.36 - 2.50 uIU/mL)  Hepatic:  03-Jan-16 14:33   Bilirubin, Total  1.4  Alkaline Phosphatase 115 (46-116 NOTE: New Reference Range 04/05/14)  SGPT (ALT) 19 (14-63 NOTE: New Reference Range 04/05/14)  SGOT (AST) 23  Total Protein, Serum 7.5  Albumin, Serum 3.4  Cardiology:  03-Jan-16 14:42   Ventricular Rate 85  Atrial Rate 61  QRS Duration 98  QT 394  QTc 468  R Axis -49  T Axis -67  ECG interpretation Atrial fibrillation Left axis deviation Incomplete right bundle branch block Nonspecific ST and T wave abnormality , probably digitalis effect Prolonged QT Abnormal ECG When compared with ECG of 16-Aug-2014 16:02, Incomplete right bundle branch block is now Present ----------unconfirmed---------- Confirmed  by OVERREAD, NOT ([id number]), editor PEARSON, BARBARA ([id number]) on 09/19/2014 1:30:15 PM  Routine Chem:  03-Jan-16 14:33   Glucose, Serum 95  BUN  34  Creatinine (comp)  2.02  Sodium, Serum 139  Potassium, Serum 4.1  Chloride, Serum  95  CO2, Serum  36  Calcium (Total), Serum 8.8  Anion Gap 8  Osmolality (calc) 285  eGFR (African American)  41  eGFR (Non-African American)  34 (eGFR values <7m/min/1.73 m2 may be an indication of chronic kidney disease (CKD). Calculated eGFR, using the MRDR Study equation, is useful in  patients with stable renal function.  The eGFR calculation will not be reliable in acutely ill patients when serum creatinine is changing rapidly. It is not useful in patients on dialysis. The eGFR calculation may not be applicable to patients at the low and high extremes of body sizes, pregnant women, and vegetarians.)  B-Type Natriuretic Peptide [facility])  2849 (Result(s) reported on 18 Sep 2014 at 03:08PM.)  04-Jan-16 04:32   Glucose, Serum  206  BUN  36  Creatinine (comp)  1.89  Sodium, Serum 139  Potassium, Serum 3.9  Chloride, Serum  97  CO2, Serum 31  Calcium (Total), Serum 8.5  Anion Gap 11  Osmolality (calc) 292  eGFR (African American)  44  eGFR (Non-African American)  36 (eGFR values <5m/min/1.73 m2 may be an indication of chronic kidney disease (CKD). Calculated eGFR, using the MRDR Study equation, is useful in  patients with stable renal function. The eGFR calculation will not be reliable in acutely ill patients when serum creatinine is changing rapidly. It is not useful in patients on dialysis. The eGFR calculation may not be applicable to patients at the low and high extremes of body sizes, pregnant women, and vegetarians.)  Cardiac:  03-Jan-16 14:33   Troponin I < 0.02 (0.00-0.05 0.05 ng/mL or less: NEGATIVE  Repeat testing in 3-6 hrs  if clinically indicated. >0.05 ng/mL: POTENTIAL  MYOCARDIAL INJURY. Repeat  testing in 3-6 hrs if  clinically  indicated. NOTE: An increase or decrease  of 30% or more on serial  testing suggests a  clinically important change)  CK, Total  33  CPK-MB, Serum 0.6 (Result(s) reported on 18 Sep 2014 at 03:08PM.)  Routine UA:  03-Jan-16 19:24   Color (UA) Yellow  Clarity (UA) Hazy  Glucose (UA) Negative  Bilirubin (UA) Negative  Ketones (UA) Negative  Specific Gravity (UA) 1.011  Blood (UA) Negative  pH (UA) 5.0  Protein (UA) 30 mg/dL  Nitrite (UA) Negative  Leukocyte Esterase (UA) Negative (Result(s) reported on 18 Sep 2014 at 07:43PM.)  RBC (UA) 3 /HPF  WBC (UA) 12 /HPF  Bacteria (UA) NONE SEEN  Epithelial Cells (UA) 2 /HPF  Mucous (UA) PRESENT  Hyaline Cast (UA) 18 /LPF (Result(s) reported on 18 Sep 2014 at 07:43PM.)  Routine Coag:  03-Jan-16 14:33   Prothrombin 14.4  INR 1.1 (INR reference interval applies to patients on anticoagulant therapy. A single INR therapeutic range for coumarins is not optimal for all indications; however, the suggested range for most indications is 2.0 - 3.0. Exceptions to the INR Reference Range may include: Prosthetic heart valves, acute myocardial infarction, prevention of myocardial infarction, and combinations of aspirin and anticoagulant. The need for a higher or lower target INR must be assessed individually. Reference: The Pharmacology and Management of the Vitamin K  antagonists: the seventh ACCP Conference on Antithrombotic and Thrombolytic Therapy. CQVZDG.3875Sept:126 (3suppl): [id number] A HCT value >55% may artifactually increase the PT.  In one study,  the increase was an average of 25%. Reference:  "Effect on Routine and Special Coagulation Testing Values of Citrate Anticoagulant Adjustment in Patients with High HCT Values." American Journal of Clinical Pathology 2006;126:400-405.)  Routine Hem:  03-Jan-16 14:33   WBC (CBC) 6.7  RBC (CBC)  4.18  Hemoglobin (CBC)  12.8  Hematocrit (CBC) 40.2  Platelet Count (CBC)  116 (Result(s)  reported on 18 Sep 2014 at 03:02PM.)  MCV 96  MCH 30.7  MCHC  31.9  RDW  16.2  04-Jan-16 04:32   WBC (CBC) 4.7  RBC (CBC)  3.97  Hemoglobin (CBC)  12.0  Hematocrit (CBC)  38.2  Platelet Count (CBC)  97  MCV 96  MCH 30.1  MCHC  31.3  RDW  16.1  Neutrophil % 87.0  Lymphocyte % 7.8  Monocyte % 4.9  Eosinophil % 0.1  Basophil % 0.2  Neutrophil # 4.1  Lymphocyte #  0.4  Monocyte # 0.2  Eosinophil # 0.0  Basophil # 0.0 (Result(s) reported on 19 Sep 2014 at 05:25AM.)    09:09   Erythrocyte Sed Rate 15 (Result(s) reported on 19 Sep 2014 at 09:49AM.)   XRay:    03-Jan-16 13:34, Chest Portable Single View  Chest Portable Single View   REASON FOR EXAM:    Chest Pain  COMMENTS:       PROCEDURE: DXR - DXR PORTABLE CHEST SINGLE VIEW  - Sep 18 2014  1:34PM     CLINICAL DATA:  Francisco [name].  Persistent back and chest pain.    EXAM:  PORTABLE CHEST - 1 VIEW    COMPARISON:  08/05/2014.    FINDINGS:  The heart is enlarged but stable. The pacer wires are unchanged.  There are chronic bronchitic changes and stable calcified pleural  plaques. Streaky bibasilar atelectasis but no infiltrates or  effusions. No pneumothorax. The bony thorax is grossly intact.     IMPRESSION:  Stable cardiac enlargement, chronic bronchitic changes and calcified  pleural plaques.    Bibasilar atelectasis but no infiltrates or effusions.      Electronically Signed    By: Kalman Jewels M.D.    On: 09/18/2014 13:44       Verified By: Marlane Hatcher, M.D.,  [place]:    04-Jan-16 10:04, US Abdomen General Survey  US Abdomen General Survey   REASON FOR EXAM:    renal failure/edema/thrombocytopenia  COMMENTS:       PROCEDURE: [place]  - US ABDOMEN GENERAL SURVEY  - Sep 19 2014 10:04AM     CLINICAL DATA:  renal failure/edema/thrombocytopenia    EXAM:  ULTRASOUND ABDOMEN COMPLETE    COMPARISON:CT, 08/16/2014    FINDINGS:  Gallbladder: Multiple dependent gallstones. Some gallstones fixed  in  the gallbladder neck. Wall is thickened to 4.7 mm. No point  tenderness to transducer pressure over the gallbladder.    Common bile duct: Diameter: 2.63m    Liver: Mildly coarsened echotexture. No liver mass or focal lesion.  Hepatopetal flow documented in the portal vein.    IVC: No abnormality visualized.    Pancreas: Partly obscured, better seen on the recent prior CT. The  questionable pancreatic tail mass noted on the prior CT is not  resolved sonographically. CT showed stability of this lesion false  likely to be a splenule adjacent to the pancreatic tail.    Spleen: Poorly visualized. Apparent splenial adjacent to the  pancreatic tail noted on the recent CT is not visualized. No gross  abnormality.    Right Kidney: Length: 10.1 cm. Cortical thinning. 15 mm cyst arises  from the upper pole. No other masses. No hydronephrosis.    Left Kidney: Length: 10.8 cm. Overall normal echogenicity. No mass  or hydronephrosis. Lower pole not well seen.    Abdominal aorta: Normal caliber proximally. Distal aorta not  visualized.    Other findings: Trace amount of fluid adjacent to the spleen and  gallbladder and inferior liver.   IMPRESSION:  1. Multiple gallstones. Gallbladder wall is also thickened. Patient  is not tender to transducer pressure over the gallbladder,  all over.  Gallbladder wall thickening is nonspecific. There is a trace amount  of ascites. Consider acute cholecystitis in the proper clinical  setting.  2. Mildly coarsened echotexture of the liver. This is nonspecific.  Consider mild diffuse fatty infiltration.  3. Right renal cortical thinning and small right renal cyst.  4. Presumed splenule adjacent to the tail the pancreas is not  resolved sonographically.      Electronically Signed    By: Lajean Manes M.D.    On: 09/19/2014 10:13         Verified By: Lasandra Beech, M.D.,  CT:    03-Jan-16 15:24, CT Chest Without Contrast  CT Chest Without  Contrast   REASON FOR EXAM:    fall with right posterior "rib and back" pain  COMMENTS:       PROCEDURE: CT  - CT CHEST WITHOUT CONTRAST  - Sep 18 2014  3:24PM     CLINICAL DATA:  Fall 4 days ago with right-sided back pain,  pleuritic chest pain and shortness of breath.    EXAM:  CT CHEST WITHOUT CONTRAST    TECHNIQUE:  Multidetector CT imaging of the chest was performed following the  standard protocol without IV contrast..  COMPARISON:  Chest x-ray earlier today. Prior CT of the chest with  contrast on 07/30/2013.    FINDINGS:  No acute fractures identified. Stable scattered areas of scarring in  both lungs as well as calcified pleural plaque bilaterally. No  pneumothorax, pulmonary consolidation, edema or pleural fluid  identified. The heart is moderately enlarged. Stable appearance of  pacemaker. No pulmonary nodules or enlarged lymph nodes are seen.  Visualized upper abdominal structures are unremarkable.     IMPRESSION:  No acute fracture or other acute findings in the chest. Stable  calcifiedpleural plaques.  Electronically Signed    By: Aletta Edouard M.D.    On: 09/18/2014 15:35         Verified By: Azzie Roup, M.D.,    Impression 81 yo man with acute on chronic kidney injury with multifactorial etiology, now improving with no specific intervention other than stopping NSAID. Francisco [name] renal function was slightly abnormal even back in 2014 when his creatinine was 1.16. I suspect this is due to baseline vascular disease as well as possibly from chronic heart failure, though it's not clear exactly how severe this has been. It's not clear exactly when he started taking NSAIDs in place of his tramadol, but it may have been prior to early December, according to his wife, and this may explain his creatinine of 1.56 at that time. Having had diverticulitis (unclear what antibiotics he received) could also have contributed to his AKI.  His LE edema is likely due to his  AKI, heart failure and NSAID use, as the latter causes sodium retention.   Plan His renal function is already improving and hence would not recommend any specific interventions other than holding NSAIDs and maximizing therapy for his CHF. He should restart the tramadol and perhaps would benefit from a long acting form in addition to prn breakthrough. He and his wife now know to avoid NSAIDs and only use tramadol and acetaminophen for pain. He should be on a 1-2 gram sodium diet to help alleviate his swelling. It will likely take several weeks for this to improve and hence should not hold up his discharge. I will continue to follow him for the duration of his inpatient stay and would like to see  him 1-2 weeks following discharge at our [place] [phone]). Thank you for including [place] in the care of this very lovely gentleman.   Electronic Signatures: Garnette Czech (MD)  (Signed 04-Jan-16 17:06)  Authored: Health Issues, General Aspect/Present Illness, Home Medications, Allergies, History and Physical Exam, Vital Signs, Labs, Radiology, Impression/Plan   Last Updated: 04-Jan-16 17:06 by Garnette Czech (MD)

## 2015-01-15 NOTE — Discharge Summary (Signed)
PATIENT NAME:  Francisco [name], Francisco [name] MR#:  [id number] DATE OF BIRTH:  12-Nov-1926  DATE OF ADMISSION:  09/18/2014 DATE OF DISCHARGE:  09/21/2014  FINAL DIAGNOSES:  1. Acute renal failure secondary to anti-inflammatory use.  2. Chronic kidney disease, stage III.  3. Anasarca/congestive heart failure, thought to be secondary to renal failure.  4. Metabolic encephalopathy with acute delirium.  5. Probable underlying dementia.  6. History of bioprosthetic aortic valve.  7. Atrial fibrillation.  8. Coronary artery disease.  9. Benign prostatic hypertrophy.  10. Obstructive sleep apnea with the patient declining CPAP.  11. Reflux disease.  12. Hypertension.  13. Osteoarthritis.   HISTORY AND PHYSICAL: Please see the dictated admission history and physical.   HOSPITAL COURSE: The patient was admitted with hypoxia, fluid retention, a 30-pound weight gain over 1 month. He also had a fall with some rib pain. CTs were performed, with no evidence of fracture or significant injury, and his pain from the fall really was not a major issue. His renal failure was more concerning, and he was evaluated by nephrology. It was determined the patient had been using anti-inflammatories heavily at home. Fortunately with stopping these medications, his renal function improved to a creatinine of 1.6, close to baseline by report. Torsemide was instituted for his significant fluid retention, and he tolerated this with his renal function reasonably stable. He had an echocardiogram performed as an outpatient, which revealed preserved LV function.   Physical therapy worked with the patient, and he showed significant weakness. His mental status and confusion at nighttime were a part of this. The decision was made that he would benefit from skilled nursing, with family members in agreement. A bed search began and a bed became available, and so he will be discharged to that facility in stable condition, with physical activity up with a  walker with assistance as tolerated. It is recommended that he be weighed daily with the physician called for more than a 2-pound gain in 1 day or 5 pounds in 1 week, or increasing signs or symptoms of heart failure.   DISCHARGE DIET: He should follow a no added salt diet.   DISCHARGE FOLLOWUP INSTRUCTIONS: He will have a follow-up set up with Dr. Alfonse Alpers with [facility] nephrology, and he will follow up with the nursing home physician otherwise. He should have a MET-B repeated in 3 to 4 days with those results to the nursing home physician.   DISCHARGE MEDICATIONS:  1. MiraLax 17 grams p.o. b.i.d. as needed for constipation.  2. Omeprazole 20 mg p.o. b.i.d.  3. Tamsulosin 0.4 mg p.o. daily.  4. Finasteride 5 mg p.o. daily.  5. Toprol-XL 100 mg p.o. daily.  6. Ultracet 325-37.5 mg 1 p.o. q.6 h. as needed for moderate pain.  7. DuoNeb SVNs 4 times a day as needed for wheezing and bronchospasm.  8. Torsemide 20 mg p.o. daily.  9. Colace 100 mg b.i.d. as needed for stool softening.  10. Seroquel 25 mg p.o. at bedtime with side effects discussed with the patient.   The patient will be on oxygen 2 liters nasal cannula continuously.   CODE STATUS: The patient is a Full Code.    ____________________________ Adin Hector, MD [id number] D: 09/21/2014 12:48:16 ET T: 09/21/2014 13:09:12 ET JOB#: [id number]  cc: Adin Hector, MD, <Dictator> Amy K. Mottl, MD Tracie Harrier, MD Ramonita Lab MD ELECTRONICALLY SIGNED 09/22/2014 8:07

## 2015-01-15 NOTE — Consult Note (Signed)
PATIENT NAME:  Francisco [name], Francisco [name] MR#:  [id number] DATE OF BIRTH:  04-16-1927  DATE OF CONSULTATION:  01/02/2015  REFERRING PHYSICIAN:  Shaune Pollack, MD  CONSULTING PHYSICIAN:  Hong Timm D. Juliann Pares, MD  PRIMARY CARE PHYSICIAN: John B. Danne Harbor, MD   CARDIOLOGIST: Lamar Blinks, MD   INDICATION: Leg edema, weakness.   HISTORY OF PRESENT ILLNESS: The patient is an 81 year old male with a history of hypertension, CHF, atrial fibrillation, sent from the nursing home because of generalized weakness and leg edema over the last several days. The patient is awake and alert, slightly demented. Wife states the patient has generalized weakness, unable to ambulate. The patient has had trouble with fever and hypoxemia. Oxygen was diminished with saturations of 86. The patient had UTI treated with Cipro. The patient has had dysuria, frequency, urgency. Denied any significant shortness of breath. Denied any blackout spells or syncope. He has had persistent lower extremity edema, treated with antibiotics for his UTI. The patient's leg edema has been persistent and recurrent. He has also been known to have gallstones, subsequently he has had recurrent trouble, treated with antibiotic Zosyn. Now here for follow-up evaluation. Denies any chest pain.   PAST MEDICAL HISTORY: Chronic diastolic heart failure, atrial fibrillation, hypertension, coronary artery disease, chronic renal insufficiency with baseline creatinine of 1.6. The patient has a bovine aortic valve, mitral regurgitation, osteoarthritis, pancreatic mass, diverticulitis, obstructive sleep apnea, GERD, gallstones.   PAST SURGICAL HISTORY: Left hip replacement, back surgery, aortic valve surgery.   SOCIAL HISTORY: Lives in a nursing home since January. Quit smoking 30 years ago. No alcohol consumption.   FAMILY HISTORY: Congestive heart failure, coronary artery disease.   ALLERGIES: CELEBREX, PHENERGAN, VICODIN.   MEDICATIONS: The patient is unable to  provide a list:   REVIEW OF SYSTEMS: No blackout spells or syncope. No significant nausea or vomiting. He has had mild fever, chills. No significant sweats. He has had urinary symptoms. No weight loss, no weight gain. No hemoptysis or hematemesis. No bright red blood per rectum. He has had some leg edema, confusion with dementia.   PHYSICAL EXAMINATION: VITAL SIGNS: Blood pressure 115/70, pulse of around 100 and irregular, respiratory rate 16, afebrile.  HEENT: Normocephalic, atraumatic. Pupils equal and reactive to light.  NECK: Supple. No significant JVD, bruits, or adenopathy.  LUNGS: Clear to auscultation and percussion. No significant wheeze, rhonchi, or rale.  HEART: Irregularly irregular, tachycardic. Systolic ejection murmur left sternal border.  ABDOMEN: Benign.  EXTREMITIES: Within normal limits.  NEUROLOGIC: Intact.  SKIN: Normal   LABORATORY DATA: Chest x-ray: Mild CHF. White count 15, hemoglobin 11, platelet count of 141,000, glucose 118, BUN 23, creatinine 1.65. Electrolytes are normal. Bilirubin 3.6, SGPT 11, SGOT 37. Troponin 0.03. BNP 755. EKG: Atrial fibrillation, rate of around 120, incomplete right bundle, nonspecific ST and T-wave changes. Lactic acid 2.1. Urinalysis showed red cells, white cells.   IMPRESSION: Sepsis, possible urinary tract infection, lactic acidosis, acute on chronic diastolic dysfunction, hypoxemia, atrial fibrillation with rapid ventricular response, hypertension, chronic renal insufficiency stage III, obstructive sleep apnea, reflux, benign prostatic hypertrophy, dementia, aortic valve replacement.   PLAN:  1. Admit to telemetry on monitor because of atrial fibrillation. Continue rate control anticoagulation for deep venous thrombosis, as well as atrial fibrillation. Continue Lopressor for rate, deep vein thrombosis prophylaxis for now. He is not a good long-term anticoagulation candidate. Would like to see a rate of around 100 or less.  2. Urinary  sepsis. Broad-spectrum antibiotics Zosyn, Levaquin. The patient  initially was treated with vancomycin. Follow-up cultures. Maintain adequate hydration.  3. Hypoxemia. Recommend oxygen support, nebulizers as necessary, as well as treatments.  4. For heart failure, Lasix therapy as needed.  5. For edema, recommend support stockings.  6. Mild anemia and thrombocytopenia. Again, short-term anticoagulation for deep venous thrombosis with heparin. Do not recommend long-term anticoagulation.  7. Hypertension, adequately control with Lopressor. Continue current therapy.  8. Renal insufficiency. Consider nephrology input. We will try to refrain from nephrotoxic drugs, but would assume a small bump in creatinine with increased IV Lasix therapy.  9. Gastroesophageal reflux disease. Continue omeprazole and Protonix therapy to help for reflux symptoms.  10. For gallstones, would recommend ultrasound with elevated liver function tests to be sure this is not related to impacted gallstone or cholecystitis.  11. We will continue conservative cardiology care for now and just maintain adequate rate control for atrial fibrillation and maintain blood pressure and hydration.     ____________________________ Bobbie Stackwayne D. Juliann Paresallwood, MD ddc:mw D: 01/02/2015 11:01:00 ET T: 01/02/2015 11:38:25 ET JOB#: [id number]  cc: Alekhya Gravlin D. Juliann Paresallwood, MD, <Dictator> Alwyn PeaWAYNE D Lonnetta Kniskern MD ELECTRONICALLY SIGNED 01/10/2015 18:21

## 2015-01-15 DEATH — deceased

## 2015-09-29 IMAGING — CR DG CHEST 2V
1 series · 3 of 3 positions shown · non-contrast
Comparison: 09/18/2014

CLINICAL DATA: Shortness of breath

EXAM:
CHEST  2 VIEW

[Series 1: dxr chest pa (or ap) and lateral · 0.14mm/px · 3 of 3 slices shown]
[im 1/3]
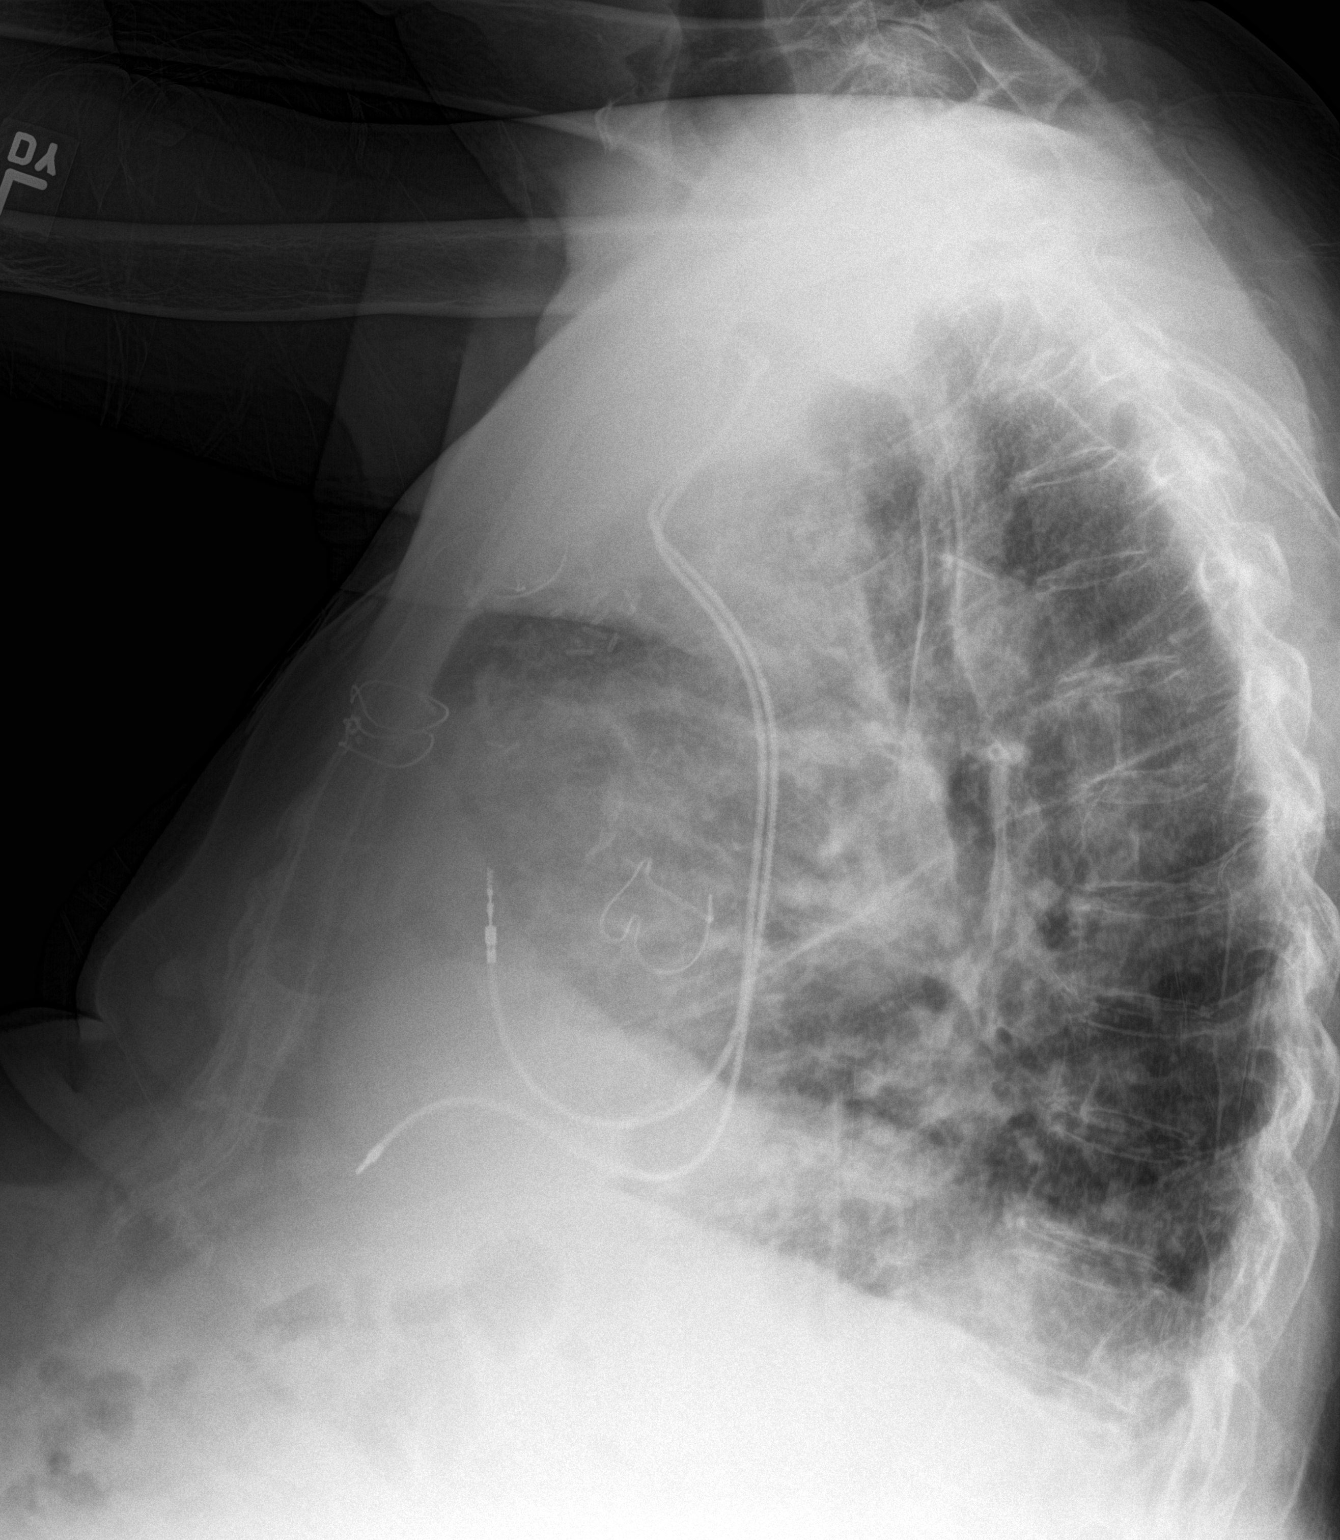
[im 2/3]
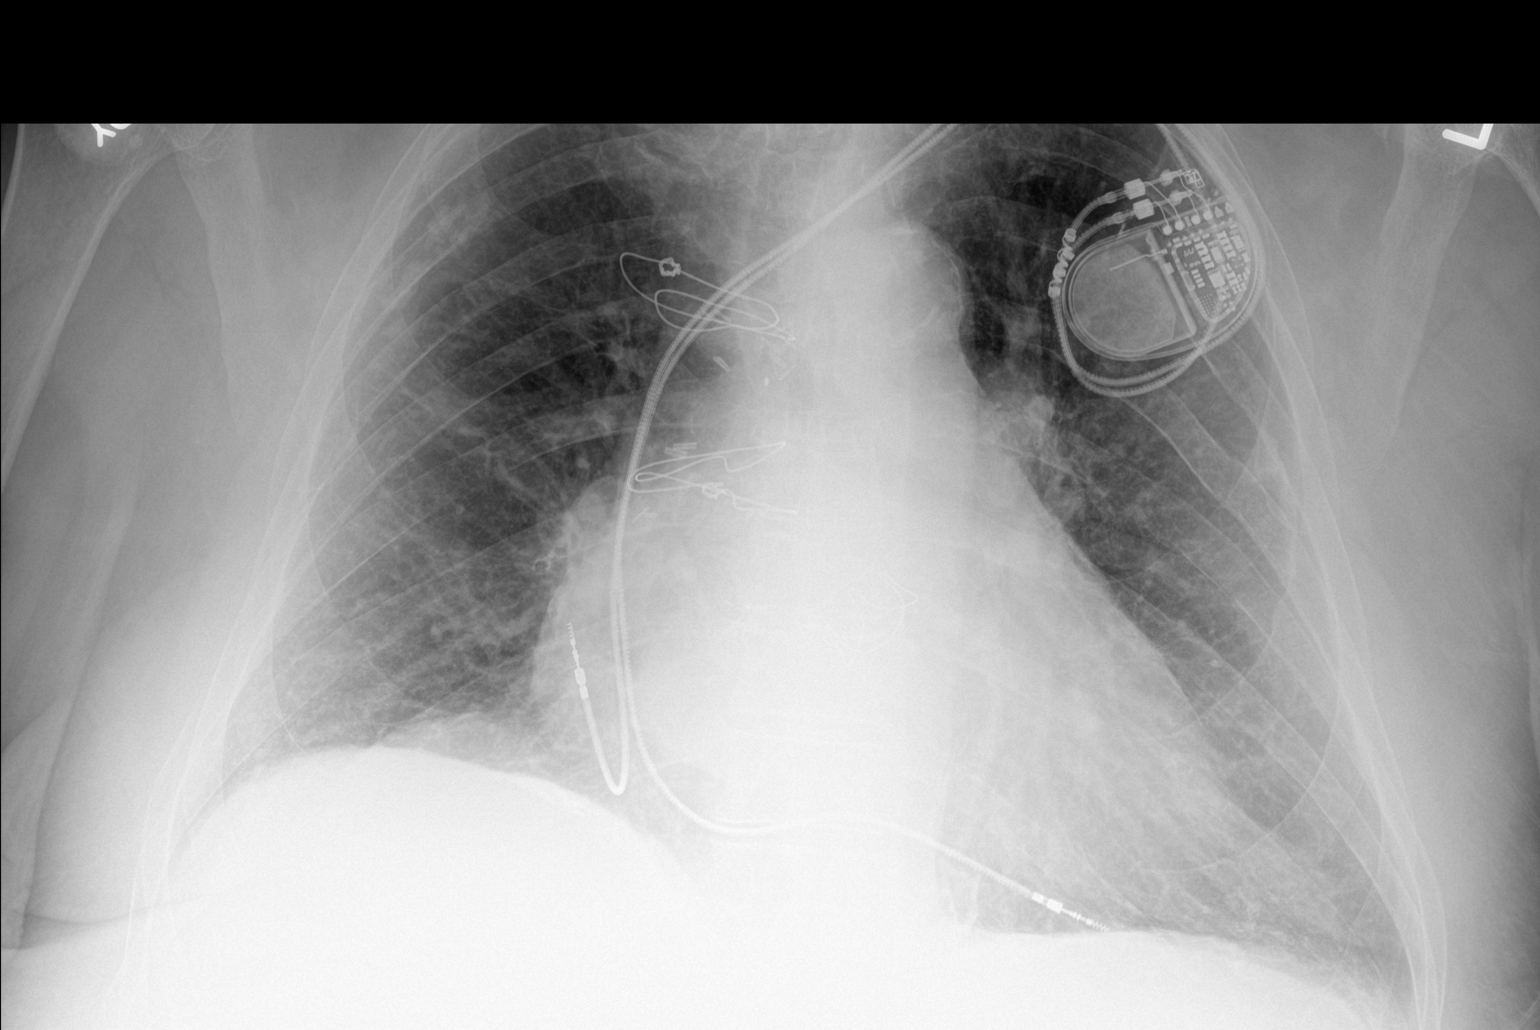
[im 3/3]
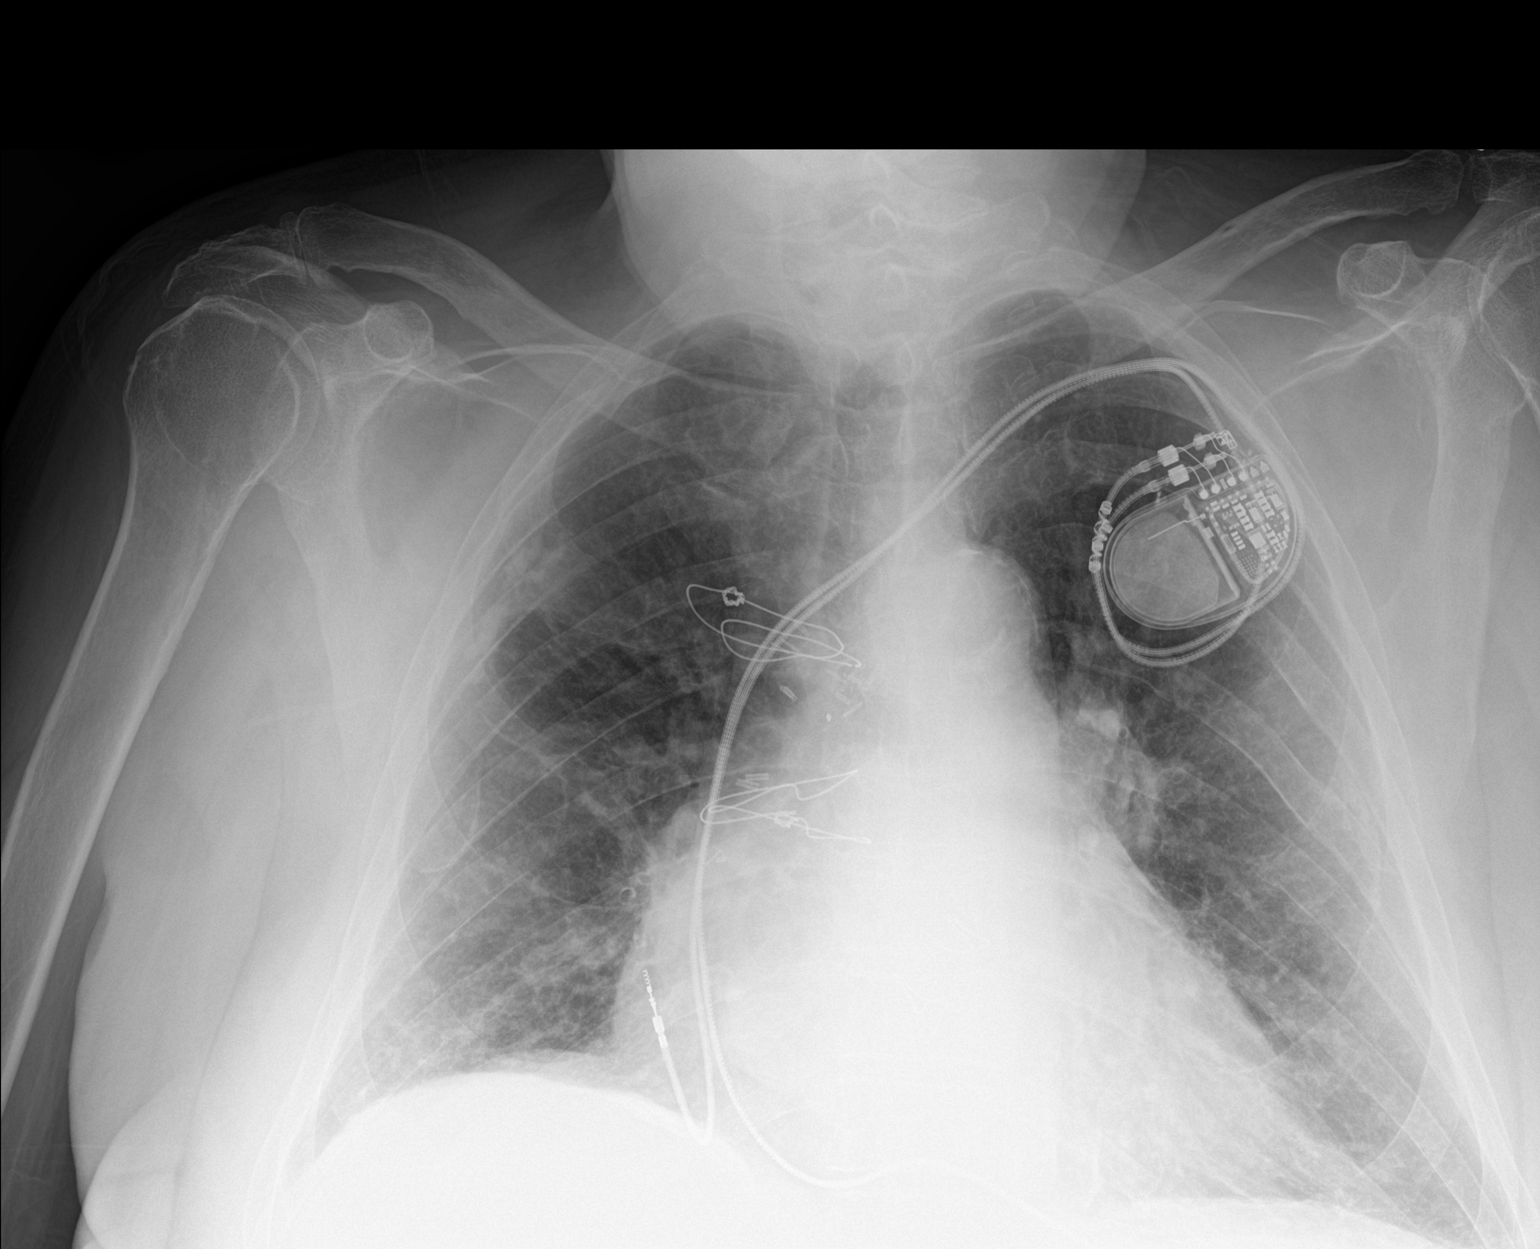

[3 of 3 positions shown; findings below may reference images not displayed]

FINDINGS: Cardiac shadow is mildly enlarged. A pacing device is again seen and
stable. Postoperative changes are noted as well. Some calcified
pleural plaques are seen. No focal infiltrate or sizable effusion is
noted.
IMPRESSION: Chronic changes without acute abnormality.

## 2015-11-22 ENCOUNTER — Other Ambulatory Visit: Payer: Self-pay | Admitting: Urology
